# Patient Record
Sex: Female | Born: 1945 | Race: White | Hispanic: No | Marital: Married | Smoking: Never smoker
Health system: Southern US, Community
[De-identification: ages and names within clinical notes are randomized; demographics above are authoritative.]

## PROBLEM LIST (undated history)

## (undated) DIAGNOSIS — Z860101 Personal history of adenomatous and serrated colon polyps: Secondary | ICD-10-CM

## (undated) DIAGNOSIS — IMO0002 Reserved for concepts with insufficient information to code with codable children: Secondary | ICD-10-CM

## (undated) DIAGNOSIS — I700 Atherosclerosis of aorta: Secondary | ICD-10-CM

## (undated) DIAGNOSIS — M7500 Adhesive capsulitis of unspecified shoulder: Secondary | ICD-10-CM

## (undated) DIAGNOSIS — R110 Nausea: Secondary | ICD-10-CM

## (undated) DIAGNOSIS — N809 Endometriosis, unspecified: Secondary | ICD-10-CM

## (undated) DIAGNOSIS — U071 COVID-19: Secondary | ICD-10-CM

## (undated) DIAGNOSIS — G8929 Other chronic pain: Secondary | ICD-10-CM

## (undated) DIAGNOSIS — N979 Female infertility, unspecified: Secondary | ICD-10-CM

## (undated) DIAGNOSIS — M5416 Radiculopathy, lumbar region: Secondary | ICD-10-CM

## (undated) DIAGNOSIS — M8580 Other specified disorders of bone density and structure, unspecified site: Secondary | ICD-10-CM

## (undated) DIAGNOSIS — Z780 Asymptomatic menopausal state: Secondary | ICD-10-CM

## (undated) HISTORY — DX: Radiculopathy, lumbar region: M54.16

## (undated) HISTORY — DX: Other chronic pain: G89.29

## (undated) HISTORY — PX: TONSILLECTOMY AND ADENOIDECTOMY: SHX28

## (undated) HISTORY — DX: Female infertility, unspecified: N97.9

## (undated) HISTORY — PX: FOOT SURGERY: SHX648

## (undated) HISTORY — DX: Personal history of adenomatous and serrated colon polyps: Z86.0101

## (undated) HISTORY — PX: CATARACT EXTRACTION: SUR2

## (undated) HISTORY — DX: COVID-19: U07.1

## (undated) HISTORY — DX: Nausea: R11.0

## (undated) HISTORY — DX: Adhesive capsulitis of unspecified shoulder: M75.00

## (undated) HISTORY — DX: Endometriosis, unspecified: N80.9

## (undated) HISTORY — DX: Other specified disorders of bone density and structure, unspecified site: M85.80

## (undated) HISTORY — DX: Atherosclerosis of aorta: I70.0

## (undated) HISTORY — DX: Asymptomatic menopausal state: Z78.0

## (undated) HISTORY — PX: APPENDECTOMY: SHX54

## (undated) HISTORY — DX: Reserved for concepts with insufficient information to code with codable children: IMO0002

---

## 1973-07-23 HISTORY — PX: AUGMENTATION MAMMAPLASTY: SUR837

## 1991-07-24 HISTORY — PX: DILATION AND CURETTAGE OF UTERUS: SHX78

## 1994-09-21 DIAGNOSIS — N809 Endometriosis, unspecified: Secondary | ICD-10-CM

## 1994-09-21 HISTORY — DX: Endometriosis, unspecified: N80.9

## 1994-09-21 HISTORY — PX: PELVIC LAPAROSCOPY: SHX162

## 1998-04-11 ENCOUNTER — Other Ambulatory Visit: Admission: RE | Admit: 1998-04-11 | Discharge: 1998-04-11 | Payer: Self-pay | Admitting: Obstetrics and Gynecology

## 1999-04-13 ENCOUNTER — Other Ambulatory Visit: Admission: RE | Admit: 1999-04-13 | Discharge: 1999-04-13 | Payer: Self-pay | Admitting: Obstetrics and Gynecology

## 1999-12-08 ENCOUNTER — Encounter: Admission: RE | Admit: 1999-12-08 | Discharge: 1999-12-08 | Payer: Self-pay | Admitting: Obstetrics and Gynecology

## 1999-12-08 ENCOUNTER — Encounter: Payer: Self-pay | Admitting: Obstetrics and Gynecology

## 2000-05-10 ENCOUNTER — Other Ambulatory Visit: Admission: RE | Admit: 2000-05-10 | Discharge: 2000-05-10 | Payer: Self-pay | Admitting: Obstetrics and Gynecology

## 2001-06-03 ENCOUNTER — Other Ambulatory Visit: Admission: RE | Admit: 2001-06-03 | Discharge: 2001-06-03 | Payer: Self-pay | Admitting: Obstetrics and Gynecology

## 2001-06-10 ENCOUNTER — Encounter: Admission: RE | Admit: 2001-06-10 | Discharge: 2001-06-10 | Payer: Self-pay | Admitting: Obstetrics and Gynecology

## 2001-06-10 ENCOUNTER — Encounter: Payer: Self-pay | Admitting: Obstetrics and Gynecology

## 2002-06-04 ENCOUNTER — Other Ambulatory Visit: Admission: RE | Admit: 2002-06-04 | Discharge: 2002-06-04 | Payer: Self-pay | Admitting: Obstetrics and Gynecology

## 2002-09-14 ENCOUNTER — Encounter: Payer: Self-pay | Admitting: Family Medicine

## 2002-09-14 ENCOUNTER — Ambulatory Visit (HOSPITAL_COMMUNITY): Admission: RE | Admit: 2002-09-14 | Discharge: 2002-09-14 | Payer: Self-pay | Admitting: Family Medicine

## 2003-06-07 ENCOUNTER — Other Ambulatory Visit: Admission: RE | Admit: 2003-06-07 | Discharge: 2003-06-07 | Payer: Self-pay | Admitting: Obstetrics and Gynecology

## 2003-10-01 ENCOUNTER — Ambulatory Visit (HOSPITAL_COMMUNITY): Admission: RE | Admit: 2003-10-01 | Discharge: 2003-10-01 | Payer: Self-pay | Admitting: Gastroenterology

## 2003-10-01 ENCOUNTER — Encounter (INDEPENDENT_AMBULATORY_CARE_PROVIDER_SITE_OTHER): Payer: Self-pay | Admitting: *Deleted

## 2004-06-01 ENCOUNTER — Encounter: Admission: RE | Admit: 2004-06-01 | Discharge: 2004-06-01 | Payer: Self-pay | Admitting: Chiropractic Medicine

## 2004-06-08 ENCOUNTER — Other Ambulatory Visit: Admission: RE | Admit: 2004-06-08 | Discharge: 2004-06-08 | Payer: Self-pay | Admitting: *Deleted

## 2004-10-03 ENCOUNTER — Encounter: Admission: RE | Admit: 2004-10-03 | Discharge: 2004-10-03 | Payer: Self-pay | Admitting: Sports Medicine

## 2004-11-27 ENCOUNTER — Encounter: Admission: RE | Admit: 2004-11-27 | Discharge: 2004-11-27 | Payer: Self-pay | Admitting: *Deleted

## 2004-11-29 ENCOUNTER — Encounter: Admission: RE | Admit: 2004-11-29 | Discharge: 2004-11-29 | Payer: Self-pay | Admitting: Gastroenterology

## 2005-04-24 ENCOUNTER — Encounter: Admission: RE | Admit: 2005-04-24 | Discharge: 2005-04-24 | Payer: Self-pay | Admitting: *Deleted

## 2005-08-08 ENCOUNTER — Other Ambulatory Visit: Admission: RE | Admit: 2005-08-08 | Discharge: 2005-08-08 | Payer: Self-pay | Admitting: Obstetrics & Gynecology

## 2005-08-16 ENCOUNTER — Other Ambulatory Visit: Admission: RE | Admit: 2005-08-16 | Discharge: 2005-08-16 | Payer: Self-pay | Admitting: Obstetrics & Gynecology

## 2005-09-20 HISTORY — PX: OTHER SURGICAL HISTORY: SHX169

## 2005-09-24 ENCOUNTER — Encounter: Admission: RE | Admit: 2005-09-24 | Discharge: 2005-09-24 | Payer: Self-pay | Admitting: Obstetrics & Gynecology

## 2005-09-25 ENCOUNTER — Ambulatory Visit (HOSPITAL_BASED_OUTPATIENT_CLINIC_OR_DEPARTMENT_OTHER): Admission: RE | Admit: 2005-09-25 | Discharge: 2005-09-25 | Payer: Self-pay | Admitting: Obstetrics & Gynecology

## 2005-09-25 ENCOUNTER — Encounter (INDEPENDENT_AMBULATORY_CARE_PROVIDER_SITE_OTHER): Payer: Self-pay | Admitting: Specialist

## 2006-04-25 ENCOUNTER — Encounter: Admission: RE | Admit: 2006-04-25 | Discharge: 2006-04-25 | Payer: Self-pay | Admitting: Obstetrics & Gynecology

## 2006-05-03 ENCOUNTER — Encounter: Admission: RE | Admit: 2006-05-03 | Discharge: 2006-05-03 | Payer: Self-pay | Admitting: Obstetrics & Gynecology

## 2006-05-24 ENCOUNTER — Encounter: Admission: RE | Admit: 2006-05-24 | Discharge: 2006-05-24 | Payer: Self-pay | Admitting: Obstetrics & Gynecology

## 2006-08-14 ENCOUNTER — Other Ambulatory Visit: Admission: RE | Admit: 2006-08-14 | Discharge: 2006-08-14 | Payer: Self-pay | Admitting: Obstetrics & Gynecology

## 2006-09-16 ENCOUNTER — Encounter: Admission: RE | Admit: 2006-09-16 | Discharge: 2006-09-16 | Payer: Self-pay | Admitting: Cardiology

## 2006-10-22 ENCOUNTER — Encounter: Admission: RE | Admit: 2006-10-22 | Discharge: 2006-10-22 | Payer: Self-pay | Admitting: Obstetrics & Gynecology

## 2007-04-28 ENCOUNTER — Encounter: Admission: RE | Admit: 2007-04-28 | Discharge: 2007-04-28 | Payer: Self-pay | Admitting: Obstetrics & Gynecology

## 2007-08-21 ENCOUNTER — Other Ambulatory Visit: Admission: RE | Admit: 2007-08-21 | Discharge: 2007-08-21 | Payer: Self-pay | Admitting: Obstetrics & Gynecology

## 2007-11-20 ENCOUNTER — Encounter: Admission: RE | Admit: 2007-11-20 | Discharge: 2007-11-20 | Payer: Self-pay | Admitting: Obstetrics & Gynecology

## 2008-05-28 ENCOUNTER — Encounter: Admission: RE | Admit: 2008-05-28 | Discharge: 2008-05-28 | Payer: Self-pay | Admitting: Obstetrics & Gynecology

## 2008-07-23 DIAGNOSIS — M7500 Adhesive capsulitis of unspecified shoulder: Secondary | ICD-10-CM

## 2008-07-23 HISTORY — DX: Adhesive capsulitis of unspecified shoulder: M75.00

## 2008-08-26 ENCOUNTER — Other Ambulatory Visit: Admission: RE | Admit: 2008-08-26 | Discharge: 2008-08-26 | Payer: Self-pay | Admitting: Obstetrics & Gynecology

## 2008-09-15 ENCOUNTER — Encounter: Admission: RE | Admit: 2008-09-15 | Discharge: 2008-09-15 | Payer: Self-pay | Admitting: Obstetrics & Gynecology

## 2008-09-15 LAB — HM DEXA SCAN

## 2009-06-20 ENCOUNTER — Encounter: Admission: RE | Admit: 2009-06-20 | Discharge: 2009-06-20 | Payer: Self-pay | Admitting: Obstetrics & Gynecology

## 2010-06-21 ENCOUNTER — Encounter: Admission: RE | Admit: 2010-06-21 | Discharge: 2010-06-21 | Payer: Self-pay | Admitting: Obstetrics & Gynecology

## 2010-12-08 NOTE — Op Note (Signed)
NAME:  Nall, Yaritzi A                             ACCOUNT NO.:  [id number]   MEDICAL RECORD NO.:  [id number]                   PATIENT TYPE:  AMB   LOCATION:  ENDO                                 FACILITY:  [facility]   PHYSICIAN:  Anselmo Rod, M.D.               DATE OF BIRTH:  Mar 28, 1946   DATE OF PROCEDURE:  10/01/2003  DATE OF DISCHARGE:  10/01/2003                                 OPERATIVE REPORT   PROCEDURE PERFORMED:  Colonoscopy with cold biopsies x 2.   ENDOSCOPIST:  Anselmo Rod, M.D.   INSTRUMENT USED:  Olympus video colonoscope (adjustable pediatric scope).   INDICATIONS FOR PROCEDURE:  A 65 year old white female, undergoes screening  colonoscopy to rule out colonic polyps, masses, etc.   PREPROCEDURE PREPARATION:  Informed consent was procured from the patient.  The patient was fasted for eight hours prior to the procedure and was  prepped with a bottle of magnesium citrate and a gallon of GoLYTELY the  night prior to the procedure.   PREPROCEDURE PHYSICAL EXAMINATION:  VITAL SIGNS:  The patient with stable  vital signs.  NECK:  Supple.  CHEST:  Clear to auscultation.  CARDIAC:  S1, S2.  Regular.  ABDOMEN:  Soft with normal bowel sounds.   DESCRIPTION OF PROCEDURE:  The patient was placed in left lateral decubitus  position, sedated with 60 mg of Demerol and 6 mg of Versed in slow  incremental doses.  Once the patient was adequately sedated and maintained  on low-flow oxygen and continuous cardiac monitoring, the Olympus video  colonoscope was advanced from the rectum to the cecum.  The appendiceal  orifice and cecal valve were clearly visualized and photographed.  The  terminal ileum appeared normal and without lesions.  A small sessile polyp  was biopsied x 2 from the cecum.  Retroflexion in the rectum revealed small  internal hemorrhoids.  The patient tolerated the procedure well without  immediate complications.   IMPRESSION:  1. Small, nonbleeding internal  hemorrhoids.  2. Small sessile polyp, biopsied from the cecal base.  3. Normal terminal ileum.   RECOMMENDATIONS:  1. Await pathology results.  2. Repeat CRC screening depending on pathology results.  3. Outpatient follow-up within the next 2 weeks, earlier if need be.                                               Anselmo Rod, M.D.    JNM/MEDQ  D:  10/01/2003  T:  10/03/2003  Job:  [id number]   cc:   Laqueta Linden, M.D.  [address], [address]  [place]  [place] [address]  Fax: [phone]

## 2010-12-08 NOTE — Op Note (Signed)
NAME:  Alexandra Cain, Alexandra Cain                   ACCOUNT NO.:  [id number]   MEDICAL RECORD NO.:  [id number]          PATIENT TYPE:  AMB   LOCATION:  [facility]                         FACILITY:  [facility]   PHYSICIAN:  M. Leda Quail, MD  DATE OF BIRTH:  1946-04-20   DATE OF PROCEDURE:  DATE OF DISCHARGE:                                 OPERATIVE REPORT   PREOPERATIVE DIAGNOSIS:  70.  A 65 year old G0 married white female with granulation of the cervix.  2.  Difficulty with tolerating a gynecologic examination.  3.  Endometriosis.   POSTOPERATIVE DIAGNOSIS:  1.  Granulation tissue of the cervix.  2.  Scarring of the cervix and posterior vaginal wall.   PROCEDURE:  Examination under anesthesia, excision of cervical granulation  tissue and cautery of granulation tissue.   ANESTHESIA:  General anesthesia administered with LMA.   ESTIMATED BLOOD LOSS:  Minimal.   FLUIDS:  500 cc of LR.   URINE OUTPUT:  There are 10 cc of clear urine drained with an I&O cath at  the beginning of the procedure.   INDICATIONS:  Ms. Klose is a 65 year old G0 married white female with a  history of difficulty with pelvic exams, as noted with her exam in January,  2007.  She appeared to have some granulation tissue on the cervix, which  bled readily on Pap smear.  Due to the amount of blood that was on her Pap  smear, her Pap was obscured by blood for evaluation.  The patient was  brought back to the office and given benzodiazepine for relaxation.  Although I could visualize the cervix better, it was still a very difficult  exam and there appeared to be some granulation tissue at the vaginal cuff,  which is slightly unusual. as she has never had any procedures done to her  vagina or cervix.  The granulation tissue was biopsied, and the biopsy did  come back showing granulation tissue.  She did have some difficulty with  that exam, but it was better; however, the entire granulation tissue was not  excised, and the  decision was made to proceed with this in the OR for better  relaxation and better visualization and complete excision of this tissue.   PROCEDURE:  The patient was taken to the operating room.  General anesthesia  was administered by anesthesia staff as an LMA.  She was placed in the  dorsal lithotomy position with her legs in Nickerson stirrups.  Her perineum,  thighs, and vagina were prepped and draped in a normal sterile fashion.  A  red rubber Foley catheter was used to drain the bladder of all urine.  A  bivalve speculum was placed in the vagina.  The cervix was visualized.  The  posterior aspect of the cervix did have some scarring to the vaginal cuff.  This was freed with some sharp dissection so the cervix could be completely  visualized.  The granulation tissue was visualized on the cervix.  This was  grasped with long pickups, and the granulation tissue was freed from the  cervix without  difficulty, basically by just pinching this with the long  forceps.  There was some bleeding noted at the areas where the granulation  tissue was removed.  This was cauterized using raw cautery.  The cervix was  completely smooth.  Monsel solution was placed on the cervix.  A bimanual  exam was performed, and the uterus was mobile and small.  The ovaries were  nonpalpable.  Rectovaginal exam was performed at this point, and the  posterior aspect of the uterus was smooth as well.  No nodularity was noted.  There were no rectal masses noted.  At this point, the procedure ended.  All  instruments were removed from the vagina.  The patient tolerated the  procedure well.  She was placed back in the supine position and awakened  from anesthesia without difficulty.  Sponge, lap, and instrument counts were  correct x2.  There were no needles used for the procedure.      Lum Keas, MD  Electronically Signed     MSM/MEDQ  D:  09/25/2005  T:  09/25/2005  Job:  [id number]

## 2011-05-29 ENCOUNTER — Other Ambulatory Visit: Payer: Self-pay | Admitting: Gastroenterology

## 2011-06-01 ENCOUNTER — Ambulatory Visit
Admission: RE | Admit: 2011-06-01 | Discharge: 2011-06-01 | Disposition: A | Discharge status: Home / Self Care | Payer: Medicare Other | Source: Ambulatory Visit | Attending: Gastroenterology | Admitting: Gastroenterology

## 2011-06-01 MED ORDER — IOHEXOL 300 MG/ML  SOLN
100.0000 mL | Freq: Once | INTRAMUSCULAR | Status: AC | PRN
  Administered 2011-06-01: 100 mL via INTRAVENOUS

## 2011-07-13 ENCOUNTER — Other Ambulatory Visit: Payer: Self-pay | Admitting: Obstetrics & Gynecology

## 2011-07-13 DIAGNOSIS — Z1231 Encounter for screening mammogram for malignant neoplasm of breast: Secondary | ICD-10-CM

## 2011-08-02 ENCOUNTER — Ambulatory Visit
Admission: RE | Admit: 2011-08-02 | Discharge: 2011-08-02 | Disposition: A | Discharge status: Home / Self Care | Payer: Medicare Other | Source: Ambulatory Visit | Attending: Obstetrics & Gynecology | Admitting: Obstetrics & Gynecology

## 2011-08-02 DIAGNOSIS — Z1231 Encounter for screening mammogram for malignant neoplasm of breast: Secondary | ICD-10-CM

## 2011-10-24 DIAGNOSIS — M9981 Other biomechanical lesions of cervical region: Secondary | ICD-10-CM | POA: Diagnosis not present

## 2011-10-24 DIAGNOSIS — M5030 Other cervical disc degeneration, unspecified cervical region: Secondary | ICD-10-CM | POA: Diagnosis not present

## 2011-10-25 DIAGNOSIS — M9981 Other biomechanical lesions of cervical region: Secondary | ICD-10-CM | POA: Diagnosis not present

## 2011-10-25 DIAGNOSIS — M5030 Other cervical disc degeneration, unspecified cervical region: Secondary | ICD-10-CM | POA: Diagnosis not present

## 2011-10-29 DIAGNOSIS — M5030 Other cervical disc degeneration, unspecified cervical region: Secondary | ICD-10-CM | POA: Diagnosis not present

## 2011-10-29 DIAGNOSIS — M9981 Other biomechanical lesions of cervical region: Secondary | ICD-10-CM | POA: Diagnosis not present

## 2011-10-30 DIAGNOSIS — H6120 Impacted cerumen, unspecified ear: Secondary | ICD-10-CM | POA: Diagnosis not present

## 2011-10-31 DIAGNOSIS — M5030 Other cervical disc degeneration, unspecified cervical region: Secondary | ICD-10-CM | POA: Diagnosis not present

## 2011-10-31 DIAGNOSIS — M9981 Other biomechanical lesions of cervical region: Secondary | ICD-10-CM | POA: Diagnosis not present

## 2011-11-02 DIAGNOSIS — M5030 Other cervical disc degeneration, unspecified cervical region: Secondary | ICD-10-CM | POA: Diagnosis not present

## 2011-11-02 DIAGNOSIS — M9981 Other biomechanical lesions of cervical region: Secondary | ICD-10-CM | POA: Diagnosis not present

## 2011-11-05 DIAGNOSIS — M9981 Other biomechanical lesions of cervical region: Secondary | ICD-10-CM | POA: Diagnosis not present

## 2011-11-05 DIAGNOSIS — M5030 Other cervical disc degeneration, unspecified cervical region: Secondary | ICD-10-CM | POA: Diagnosis not present

## 2011-11-08 DIAGNOSIS — Z01419 Encounter for gynecological examination (general) (routine) without abnormal findings: Secondary | ICD-10-CM | POA: Diagnosis not present

## 2011-11-08 DIAGNOSIS — Z124 Encounter for screening for malignant neoplasm of cervix: Secondary | ICD-10-CM | POA: Diagnosis not present

## 2011-11-08 DIAGNOSIS — Z0000 Encounter for general adult medical examination without abnormal findings: Secondary | ICD-10-CM | POA: Diagnosis not present

## 2011-11-20 DIAGNOSIS — E782 Mixed hyperlipidemia: Secondary | ICD-10-CM | POA: Diagnosis not present

## 2011-11-22 ENCOUNTER — Ambulatory Visit
Admission: RE | Admit: 2011-11-22 | Discharge: 2011-11-22 | Disposition: A | Discharge status: Home / Self Care | Payer: Medicare Other | Source: Ambulatory Visit | Attending: Internal Medicine | Admitting: Internal Medicine

## 2011-11-22 ENCOUNTER — Other Ambulatory Visit: Payer: Self-pay | Admitting: Internal Medicine

## 2011-11-22 DIAGNOSIS — R51 Headache: Secondary | ICD-10-CM

## 2011-11-22 DIAGNOSIS — G319 Degenerative disease of nervous system, unspecified: Secondary | ICD-10-CM | POA: Diagnosis not present

## 2011-12-06 DIAGNOSIS — R51 Headache: Secondary | ICD-10-CM | POA: Diagnosis not present

## 2012-02-12 DIAGNOSIS — L821 Other seborrheic keratosis: Secondary | ICD-10-CM | POA: Diagnosis not present

## 2012-02-12 DIAGNOSIS — D239 Other benign neoplasm of skin, unspecified: Secondary | ICD-10-CM | POA: Diagnosis not present

## 2012-03-18 DIAGNOSIS — R1013 Epigastric pain: Secondary | ICD-10-CM | POA: Diagnosis not present

## 2012-03-18 DIAGNOSIS — H00019 Hordeolum externum unspecified eye, unspecified eyelid: Secondary | ICD-10-CM | POA: Diagnosis not present

## 2012-03-25 DIAGNOSIS — H00019 Hordeolum externum unspecified eye, unspecified eyelid: Secondary | ICD-10-CM | POA: Diagnosis not present

## 2012-03-25 DIAGNOSIS — H43819 Vitreous degeneration, unspecified eye: Secondary | ICD-10-CM | POA: Diagnosis not present

## 2012-03-25 DIAGNOSIS — H52209 Unspecified astigmatism, unspecified eye: Secondary | ICD-10-CM | POA: Diagnosis not present

## 2012-03-25 DIAGNOSIS — H2510 Age-related nuclear cataract, unspecified eye: Secondary | ICD-10-CM | POA: Diagnosis not present

## 2012-05-07 DIAGNOSIS — Z23 Encounter for immunization: Secondary | ICD-10-CM | POA: Diagnosis not present

## 2012-06-02 DIAGNOSIS — H6120 Impacted cerumen, unspecified ear: Secondary | ICD-10-CM | POA: Diagnosis not present

## 2012-06-17 DIAGNOSIS — M79609 Pain in unspecified limb: Secondary | ICD-10-CM | POA: Diagnosis not present

## 2012-11-10 ENCOUNTER — Telehealth: Payer: Self-pay | Admitting: Obstetrics and Gynecology

## 2012-11-10 ENCOUNTER — Other Ambulatory Visit: Payer: Self-pay | Admitting: Obstetrics & Gynecology

## 2012-11-10 ENCOUNTER — Ambulatory Visit: Payer: Self-pay | Admitting: Obstetrics and Gynecology

## 2012-11-10 NOTE — Telephone Encounter (Signed)
Pt cancel appointment due to Medicare amount $136.43. Pt will have her annual done at her primary physician.

## 2012-11-10 NOTE — Telephone Encounter (Signed)
Pt is wanting a sample of Combipack .05/.25. [place] Si box. She does not want to refill since she will be seeing Dr. Edward Jolly for the first time on 11/13/12. She just needs a sample to hold her over until then. She was supposed to put on a new patch yesterday but does not have any. She was trying to hold out until she was seen.

## 2012-11-11 NOTE — Telephone Encounter (Signed)
Request for Combi-patch patient has appt on 11/13/12 with BS

## 2012-11-11 NOTE — Telephone Encounter (Signed)
Patient given one box of Combipack till see Dr. Edward Jolly on Thursday.

## 2012-11-12 ENCOUNTER — Encounter: Payer: Self-pay | Admitting: Obstetrics and Gynecology

## 2012-11-13 ENCOUNTER — Ambulatory Visit (INDEPENDENT_AMBULATORY_CARE_PROVIDER_SITE_OTHER): Payer: Medicare Other | Admitting: Obstetrics and Gynecology

## 2012-11-13 ENCOUNTER — Encounter: Payer: Self-pay | Admitting: Obstetrics and Gynecology

## 2012-11-13 VITALS — BP 120/70 | Ht 62.75 in | Wt 119.0 lb

## 2012-11-13 DIAGNOSIS — N6320 Unspecified lump in the left breast, unspecified quadrant: Secondary | ICD-10-CM

## 2012-11-13 DIAGNOSIS — Z01419 Encounter for gynecological examination (general) (routine) without abnormal findings: Secondary | ICD-10-CM

## 2012-11-13 DIAGNOSIS — N9089 Other specified noninflammatory disorders of vulva and perineum: Secondary | ICD-10-CM | POA: Diagnosis not present

## 2012-11-13 DIAGNOSIS — N951 Menopausal and female climacteric states: Secondary | ICD-10-CM | POA: Insufficient documentation

## 2012-11-13 DIAGNOSIS — N63 Unspecified lump in unspecified breast: Secondary | ICD-10-CM

## 2012-11-13 DIAGNOSIS — Z0000 Encounter for general adult medical examination without abnormal findings: Secondary | ICD-10-CM

## 2012-11-13 DIAGNOSIS — N952 Postmenopausal atrophic vaginitis: Secondary | ICD-10-CM | POA: Diagnosis not present

## 2012-11-13 LAB — POCT URINALYSIS DIPSTICK
Glucose, UA: NEGATIVE
Urobilinogen, UA: NEGATIVE

## 2012-11-13 NOTE — Patient Instructions (Addendum)
EXERCISE AND DIET:  We recommended that you start or continue a regular exercise program for good health. Regular exercise means any activity that makes your heart beat faster and makes you sweat.  We recommend exercising at least 30 minutes per day at least 3 days a week, preferably 4 or 5.  We also recommend a diet low in fat and sugar.  Inactivity, poor dietary choices and obesity can cause diabetes, heart attack, stroke, and kidney damage, among others.    ALCOHOL AND SMOKING:  Women should limit their alcohol intake to no more than 7 drinks/beers/glasses of wine (combined, not each!) per week. Moderation of alcohol intake to this level decreases your risk of breast cancer and liver damage. And of course, no recreational drugs are part of a healthy lifestyle.  And absolutely no smoking or even second hand smoke. Most people know smoking can cause heart and lung diseases, but did you know it also contributes to weakening of your bones? Aging of your skin?  Yellowing of your teeth and nails?  CALCIUM AND VITAMIN D:  Adequate intake of calcium and Vitamin D are recommended.  The recommendations for exact amounts of these supplements seem to change often, but generally speaking 600 mg of calcium (either carbonate or citrate) and 800 units of Vitamin D per day seems prudent. Certain women may benefit from higher intake of Vitamin D.  If you are among these women, your doctor will have told you during your visit.    PAP SMEARS:  Pap smears, to check for cervical cancer or precancers,  have traditionally been done yearly, although recent scientific advances have shown that most women can have pap smears less often.  However, every woman still should have a physical exam from her gynecologist every year. It will include a breast check, inspection of the vulva and vagina to check for abnormal growths or skin changes, a visual exam of the cervix, and then an exam to evaluate the size and shape of the uterus and  ovaries.  And after 67 years of age, a rectal exam is indicated to check for rectal cancers. We will also provide age appropriate advice regarding health maintenance, like when you should have certain vaccines, screening for sexually transmitted diseases, bone density testing, colonoscopy, mammograms, etc.   MAMMOGRAMS:  All women over 40 years old should have a yearly mammogram. Many facilities now offer a "3D" mammogram, which may cost around $50 extra out of pocket. If possible,  we recommend you accept the option to have the 3D mammogram performed.  It both reduces the number of women who will be called back for extra views which then turn out to be normal, and it is better than the routine mammogram at detecting truly abnormal areas.    COLONOSCOPY:  Colonoscopy to screen for colon cancer is recommended for all women at age 50.  We know, you hate the idea of the prep.  We agree, BUT, having colon cancer and not knowing it is worse!!  Colon cancer so often starts as a polyp that can be seen and removed at colonscopy, which can quite literally save your life!  And if your first colonoscopy is normal and you have no family history of colon cancer, most women don't have to have it again for 10 years.  Once every ten years, you can do something that may end up saving your life, right?  We will be happy to help you get it scheduled when you are ready.    Be sure to check your insurance coverage so you understand how much it will cost.  It may be covered as a preventative service at no cost, but you should check your particular policy.    Atrophic Vaginitis Atrophic vaginitis is a problem of low levels of estrogen in women. This problem can happen at any age. It is most common in women who have gone through menopause ("the change").  HOW WILL I KNOW IF I HAVE THIS PROBLEM? You may have:  Trouble with peeing (urinating), such as:  Going to the bathroom often.  A hard time holding your pee until you reach a  bathroom.  Leaking pee.  Having pain when you pee.  Itching or a burning feeling.  Vaginal bleeding and spotting.  Pain during sex.  Dryness of the vagina.  A yellow, bad-smelling fluid (discharge) coming from the vagina. HOW WILL MY DOCTOR CHECK FOR THIS PROBLEM?  During your exam, your doctor will likely find the problem.  If there is a vaginal fluid, it may be checked for infection. HOW WILL THIS PROBLEM BE TREATED? Keep the vulvar skin as clean as possible. Moisturizers and lubricants can help with some of the symptoms. Estrogen replacement can help. There are 2 ways to take estrogen:  Systemic estrogen gets estrogen to your whole body. It takes many weeks or months before the symptoms get better.  You take an estrogen pill.  You use a skin patch. This is a patch that you put on your skin.  If you still have your uterus, your doctor may ask you to take a hormone. Talk to your doctor about the right medicine for you.  Estrogen cream.  This puts estrogen only at the part of your body where you apply it. The cream is put into the vagina or put on the vulvar skin. For some women, estrogen cream works faster than pills or the patch. CAN ALL WOMEN WITH THIS PROBLEM USE ESTROGEN? No. Women with certain types of cancer, liver problems, or problems with blood clots should not take estrogen. Your doctor can help you decide the best treatment for your symptoms. Document Released: 12/26/2007 Document Revised: 10/01/2011 Document Reviewed: 12/26/2007 [employer] Patient Information 2013 [employer].   

## 2012-11-13 NOTE — Progress Notes (Signed)
Patient ID: Alexandra Cain, female   DOB: 09-Apr-1946, 67 y.o.   MRN: [id number] 67 y.o.  Married  Caucasian female   G0P0 here for annual exam.   Patient asking about various issues.  Asking about anitperspirants.  Asking about baby aspirin daily.  T cholesterol is about 220 with elevated LDL cholesterol Patient on HRT for 20 years.  Tried to stop twice and had significant hot flashes.  Asking about whether or not she should continue. Patient having discharge vaginally.  No itching, burning, or odor.  No over the counter treatments.   Notes a wart on her bottom.   No LMP recorded. Patient is postmenopausal.          Sexually active: yes  The current method of family planning is post menopausal status.    Exercising: biking, walking Last mammogram:  07/2011 wnl: [facility] Last pap smear:11-08-11 wnl:no HPV done History of abnormal pap: no Smoking: no Alcohol: no Last colonoscopy: 2011 revealed polyps: Dr. Loreta Ave. Next colon due 2016 Last Bone Density:  09/2008 [facility] Osteopenia, T score spine - 1.1, hip -0.7.  Did not tolerate Fosamax due to stomach issues.   Last tetanus shot: unsure Last cholesterol check: 2013  Hgb: 14.3               Urine: neg    Health Maintenance  Topic Date Due  . Pap Smear  11/07/2012  . Influenza Vaccine  03/23/2013  . Mammogram  08/01/2013  . Colonoscopy  09/01/2013  . Tetanus/tdap  10/21/2013  . Pneumococcal Polysaccharide Vaccine Age 66 And Over  Completed  . Zostavax  Completed    Family History  Problem Relation Age of Onset  . Dementia Mother   . Breast cancer Paternal Grandmother     There is no problem list on file for this patient.   Past Medical History  Diagnosis Date  . Frozen shoulder 2010  . Menopause   . Endometriosis 3/96    bx neg, suspicious for endo  . Infertility, female   . Ulcer   . Diabetes mellitus without complication     Past Surgical History  Procedure Laterality Date  . Dilation and curettage of  uterus  1993  . Appendectomy    . Other surgical history  3/07    excision fo cx granuloma tissue under anesthesia  . Tonsillectomy and adenoidectomy    . Augmentation mammaplasty  1975  . Pelvic laparoscopy  3/96    due to pelvic pain    Allergies: Review of patient's allergies indicates no known allergies.  Current Outpatient Prescriptions  Medication Sig Dispense Refill  . aspirin EC 81 MG tablet Take 81 mg by mouth daily.      . Calcium 1500 MG tablet Take 1,500 mg by mouth daily.      . cholecalciferol (VITAMIN D) 1000 UNITS tablet Take 1,000 Units by mouth daily.      . COMBIPATCH 0.05-0.25 MG/DAY APPLY ONE PATCH TWICE A WEEK  8 patch  0  . Multiple Vitamin (MULTIVITAMIN) tablet Take 1 tablet by mouth daily.      . Omega-3 Fatty Acids (FISH OIL PO) Take by mouth daily.       No current facility-administered medications for this visit.    ROS: Pertinent items are noted in HPI.  Social Hx:    Exam:    BP 120/70  Ht 5' 2.75" (1.594 m)  Wt 119 lb (53.978 kg)  BMI 21.24 kg/m2  Wt Readings from Last 3 Encounters:  11/13/12 119 lb (53.978 kg)     Ht Readings from Last 3 Encounters:  11/13/12 5' 2.75" (1.594 m)    General appearance: alert, cooperative and appears stated age Head: Normocephalic, without obvious abnormality, atraumatic Neck: no adenopathy, supple, symmetrical, trachea midline and thyroid not enlarged, symmetric, no tenderness/mass/nodules Lungs: clear to auscultation bilaterally Breasts: Bilateral implants.  Right breast - Inspection negative, No nipple retraction or dimpling, No nipple discharge or bleeding, No axillary or supraclavicular adenopathy, Normal to palpation without dominant masses.  Scarring palpable along the areolar incision of the right breast.  Left breast - inspection negative, no nipple retraction or dimpling, no nipple discharge or bleeding.  No axillary or supraclavicular adenopathy.  Small 3 - 4 mm nodule palpable at the areolar  margin at 12 o'clock, nontender. Heart: regular rate and rhythm Abdomen: soft, non-tender; bowel sounds normal; no masses,  no organomegaly Extremities: extremities normal, atraumatic, no cyanosis or edema Skin: Skin color, texture, turgor normal. No rashes or lesions Lymph nodes: Cervical, supraclavicular, and axillary nodes normal. No abnormal inguinal nodes palpated Neurologic: Grossly normal   Pelvic: External genitalia:  Right perineal body with thickening and verugated area.              Urethra:  normal appearing urethra with no masses, tenderness or lesions              Bartholins and Skenes: normal                 Vagina: Erythematous appearing vaginal mucosa with increased yellow discharge.              Cervix: normal appears flush with the vaginal apex.  Inflammation noted.  Bleeds easily with contact with the speculum.              Pap taken: yes and HR HPV requested.        Bimanual Exam:  Uterus:  uterus is normal size, shape, consistency and nontender                                      Adnexa: normal adnexa in size, nontender and no masses                                      Rectovaginal: Confirms                                      Anus:  normal sphincter tone, no lesions  Wet prep - No clue cells.  No hyphae.  PH 5.5  A: Left breast mass. HRT patient. Overdue for mammogram Bilateral breast implants. 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     P: Bilateral diagnostic mammogram and left breast ultrasound. pap smear and HR HPV Patient will return for re-evaluation for HRT and discussion of possible vaginal estrogen therapy after mammogram is completed and normal. Will also proceed with vulvar biopsy. return annually or prn     An After Visit Summary was printed and given to the patient.

## 2012-11-14 ENCOUNTER — Ambulatory Visit
Admission: RE | Admit: 2012-11-14 | Discharge: 2012-11-14 | Disposition: A | Discharge status: Home / Self Care | Payer: Medicare Other | Source: Ambulatory Visit | Attending: Obstetrics and Gynecology | Admitting: Obstetrics and Gynecology

## 2012-11-14 DIAGNOSIS — N6320 Unspecified lump in the left breast, unspecified quadrant: Secondary | ICD-10-CM

## 2012-11-14 DIAGNOSIS — N63 Unspecified lump in unspecified breast: Secondary | ICD-10-CM | POA: Diagnosis not present

## 2012-11-14 DIAGNOSIS — Z1151 Encounter for screening for human papillomavirus (HPV): Secondary | ICD-10-CM | POA: Diagnosis not present

## 2012-11-14 DIAGNOSIS — Z01419 Encounter for gynecological examination (general) (routine) without abnormal findings: Secondary | ICD-10-CM | POA: Diagnosis not present

## 2012-11-17 LAB — HEMOGLOBIN, FINGERSTICK: Hemoglobin, fingerstick: 14.3 g/dL (ref 12.0–16.0)

## 2012-11-19 ENCOUNTER — Telehealth: Payer: Self-pay | Admitting: Obstetrics and Gynecology

## 2012-11-19 NOTE — Telephone Encounter (Signed)
Called pt about scheduling her vulvar biopsy. Pt said she will call back when ready to schedule due to being out of town for a few weeks. She also wanted to let you know that she had her MMG completed and they told her she looked completely healthy. She would like to continue taking estrogen. She wanted to know if she could have more samples or if she will need to refill her prescription. Please advise.

## 2012-11-20 ENCOUNTER — Other Ambulatory Visit: Payer: Self-pay | Admitting: Obstetrics and Gynecology

## 2012-11-20 MED ORDER — ESTRADIOL-NORETHINDRONE ACET 0.05-0.25 MG/DAY TD PTTW: 1.0000 | MEDICATED_PATCH | TRANSDERMAL | Status: DC

## 2012-11-20 NOTE — Telephone Encounter (Signed)
Alexandra Cain,  Please let the patient know that I have refilled her Combipatch prescription for one year.  [employer]

## 2012-11-21 ENCOUNTER — Telehealth: Payer: Self-pay | Admitting: *Deleted

## 2012-11-21 NOTE — Telephone Encounter (Signed)
Left message on CB# of need to return call to our office. 

## 2012-11-21 NOTE — Telephone Encounter (Signed)
Message copied by Elnora Morrison on Fri Nov 21, 2012  4:58 PM ------      Message from: Conley Simmonds      Created: Fri Nov 21, 2012  7:26 AM      Regarding: please schedule a breast recheck appointment       Hi Alexandra Cain,            Will you contact the patient to schedule a breast recheck appointment.  I did receive her final mammogram and ultrasound report, but she needs a recheck with me in 4 - 6 weeks.  Please continue to keep in mammogram hold until this is completed.            IF she wants to proceed with the vulvar biopsy at the same time, this will need to have a precert.  Diagnosis is vulvar lesion.            Thank you!            Conley Simmonds ------

## 2012-11-21 NOTE — Telephone Encounter (Signed)
Left message onf CB# home and cell of need for patient to call to follow  Up with Dr. Edward Jolly of mammogram and u/s reports. Appointment needed with Dr. Ander Purpura

## 2012-11-24 ENCOUNTER — Telehealth: Payer: Self-pay | Admitting: *Deleted

## 2012-11-24 NOTE — Telephone Encounter (Signed)
Alexandra Cain,  I recall that the patient may be currently traveling.  [employer]

## 2012-11-24 NOTE — Telephone Encounter (Signed)
Left message on all CB# s of need to call our office to follow up with Dr. Edward Jolly for [facility] findings.

## 2012-11-24 NOTE — Telephone Encounter (Signed)
Message copied by Elnora Morrison on Mon Nov 24, 2012  4:37 PM ------      Message from: Conley Simmonds      Created: Fri Nov 21, 2012  7:26 AM      Regarding: please schedule a breast recheck appointment       Hi Fannie Knee,            Will you contact the patient to schedule a breast recheck appointment.  I did receive her final mammogram and ultrasound report, but she needs a recheck with me in 4 - 6 weeks.  Please continue to keep in mammogram hold until this is completed.            IF she wants to proceed with the vulvar biopsy at the same time, this will need to have a precert.  Diagnosis is vulvar lesion.            Thank you!            Conley Simmonds ------

## 2012-11-24 NOTE — Telephone Encounter (Signed)
Have called several times on Friday and today , 11/24/2012, . Left message at home and cell # of need to return call to schedule appt. Will try tomorrow also. sue

## 2012-11-24 NOTE — Telephone Encounter (Signed)
Left message on cell and home # of need to call our office and schedule appt. Follow up with Dr. Edward Jolly for mammogram findings.

## 2012-11-25 ENCOUNTER — Telehealth: Payer: Self-pay | Admitting: *Deleted

## 2012-11-25 NOTE — Telephone Encounter (Signed)
Message copied by Elnora Morrison on Tue Nov 25, 2012  9:31 AM ------      Message from: Conley Simmonds      Created: Fri Nov 21, 2012  7:26 AM      Regarding: please schedule a breast recheck appointment       Hi Fannie Knee,            Will you contact the patient to schedule a breast recheck appointment.  I did receive her final mammogram and ultrasound report, but she needs a recheck with me in 4 - 6 weeks.  Please continue to keep in mammogram hold until this is completed.            IF she wants to proceed with the vulvar biopsy at the same time, this will need to have a precert.  Diagnosis is vulvar lesion.            Thank you!            Conley Simmonds ------

## 2012-11-25 NOTE — Telephone Encounter (Signed)
Patient notified of Dr. Edward Jolly response . Patient states will call back and schedule for this. States will be traveling and will have to check her schedule. Will keep report in mammogram hold. sue

## 2012-11-26 NOTE — Telephone Encounter (Signed)
Patient placed in recall list 04 for recheck with Dr. Edward Jolly.

## 2012-11-26 NOTE — Telephone Encounter (Signed)
Thank for for contacting patient and keeping in mammogram hold.  [employer]

## 2012-12-01 NOTE — Telephone Encounter (Signed)
Left detailed message that ok to do biopsy the same day as her breast recheck//kn

## 2012-12-29 DIAGNOSIS — M9981 Other biomechanical lesions of cervical region: Secondary | ICD-10-CM | POA: Diagnosis not present

## 2012-12-29 DIAGNOSIS — M5030 Other cervical disc degeneration, unspecified cervical region: Secondary | ICD-10-CM | POA: Diagnosis not present

## 2012-12-30 ENCOUNTER — Ambulatory Visit (INDEPENDENT_AMBULATORY_CARE_PROVIDER_SITE_OTHER): Payer: Medicare Other | Admitting: Obstetrics & Gynecology

## 2012-12-30 VITALS — BP 104/62 | HR 60 | Resp 16 | Ht 62.5 in | Wt 119.8 lb

## 2012-12-30 DIAGNOSIS — F411 Generalized anxiety disorder: Secondary | ICD-10-CM | POA: Diagnosis not present

## 2012-12-30 DIAGNOSIS — R109 Unspecified abdominal pain: Secondary | ICD-10-CM | POA: Diagnosis not present

## 2012-12-30 DIAGNOSIS — Z0000 Encounter for general adult medical examination without abnormal findings: Secondary | ICD-10-CM | POA: Diagnosis not present

## 2012-12-30 DIAGNOSIS — N907 Vulvar cyst: Secondary | ICD-10-CM

## 2012-12-30 DIAGNOSIS — N9489 Other specified conditions associated with female genital organs and menstrual cycle: Secondary | ICD-10-CM | POA: Diagnosis not present

## 2012-12-30 DIAGNOSIS — E756 Lipid storage disorder, unspecified: Secondary | ICD-10-CM

## 2012-12-30 DIAGNOSIS — R141 Gas pain: Secondary | ICD-10-CM

## 2012-12-30 DIAGNOSIS — R1909 Other intra-abdominal and pelvic swelling, mass and lump: Secondary | ICD-10-CM

## 2012-12-30 DIAGNOSIS — M7090 Unspecified soft tissue disorder related to use, overuse and pressure of unspecified site: Secondary | ICD-10-CM

## 2012-12-30 MED ORDER — SERTRALINE HCL 50 MG PO TABS: 50.0000 mg | ORAL_TABLET | Freq: Every day | ORAL | Status: DC

## 2012-12-30 NOTE — Progress Notes (Signed)
67 y.o. Married Caucasian female here for possible vulvar biopsy.  Patient well known to me.  She has several questions today as well as her concern over having the biopsy.  The areas Dr. Edward Jolly was concerned about the patient feels have been present for a long time.  She also feels I have discussed these with her in the past.  She denies vaginal bleeding or pelvic pain.  She reports not being very happy with her last visit.  Here for my opinion.  She continues to have abdominal pain that has been fully evaluated.  She is experiencing a lot of stress over the care of her mother.  Her mother is in an assisted living facility but has Alzheimer's and just doesn't remember anything.  Pt went on trip a few weeks ago to [place] where they rested and biked.  Pain was absent for entire trip.  Patient feels like pain restarted as soon as she got of the airplane.  She felt the stress return at the exact same time. She now realizes how important stress management is going to be for her.    She would like to have a Ca-125 check if possible and needs her lipids checked as well.    Exam:   BP 104/62  Pulse 60  Resp 16  Ht 5' 2.5" (1.588 m)  Wt 119 lb 12.8 oz (54.341 kg)  BMI 21.55 kg/m2  LMP 07/24/1991 General appearance: alert and cooperative Lymph:  Inguinal adenopathy: neg  External genitalia:  atrophic appearance and two small sebaceous cysts present on right labia.  No other findings noted.  Patient and I looked together with mirror and agree these are the areas of concern Pap taken: no  I do not feel these vulvar lesions need to be biopsied.  Assessment:  Sebaceous cysts of vulva            IBS             Abdominal pain from stressors  Plan:  Zoloft 50mg  to pharmacy.  Recheck 1 month. Ca 125 and FLP  ~15 minutes spent with patient >50% of time was in face to face discussion of above.

## 2012-12-30 NOTE — Patient Instructions (Addendum)
We will call you with your lab results 

## 2012-12-31 ENCOUNTER — Encounter: Payer: Self-pay | Admitting: Obstetrics and Gynecology

## 2013-01-01 ENCOUNTER — Encounter: Payer: Self-pay | Admitting: Obstetrics & Gynecology

## 2013-01-01 DIAGNOSIS — M5030 Other cervical disc degeneration, unspecified cervical region: Secondary | ICD-10-CM | POA: Diagnosis not present

## 2013-01-01 DIAGNOSIS — M9981 Other biomechanical lesions of cervical region: Secondary | ICD-10-CM | POA: Diagnosis not present

## 2013-01-02 ENCOUNTER — Telehealth: Payer: Self-pay | Admitting: Obstetrics & Gynecology

## 2013-01-02 NOTE — Telephone Encounter (Signed)
Called to notify asDr Hyacinth Meeker instructed.  Patient reports medication is working and she wants to continue.  She is noticing improvement already.  She thought Dr Hyacinth Meeker instructed her to call if this "hot" feeling occurred so she was wanting to be sure it was ok to continue.  Verified with Dr Hyacinth Meeker that this ok and she may continue.  Call prn.

## 2013-01-02 NOTE — Telephone Encounter (Signed)
Will be about two full weeks before she will be able to say it is not working.  Please stay on it.  I really think it will help if she will give it a little time.

## 2013-01-02 NOTE — Telephone Encounter (Signed)
Patient was Rx zoloft 50 mg on Tuesday for stress. She has taken 3 pills and is now experiencing some discomfort. She is burning up and sweating very bad.

## 2013-01-02 NOTE — Telephone Encounter (Signed)
See note below from patient. Called patient states is a hot feeling not like a hot flash, just hot enough to have to fan herself. Asked if this is short term? Medication does seen to be helping with stress only though she has taken 3 pills. States if this not harmful to her she would like to continue medication. Please advise. sue

## 2013-01-05 DIAGNOSIS — E756 Lipid storage disorder, unspecified: Secondary | ICD-10-CM | POA: Diagnosis not present

## 2013-01-05 DIAGNOSIS — R109 Unspecified abdominal pain: Secondary | ICD-10-CM | POA: Diagnosis not present

## 2013-01-05 DIAGNOSIS — Z0000 Encounter for general adult medical examination without abnormal findings: Secondary | ICD-10-CM | POA: Diagnosis not present

## 2013-01-05 DIAGNOSIS — R1909 Other intra-abdominal and pelvic swelling, mass and lump: Secondary | ICD-10-CM | POA: Diagnosis not present

## 2013-01-05 LAB — LIPID PANEL
HDL: 61 mg/dL (ref >39)
LDL Cholesterol: 165 mg/dL — ABNORMAL HIGH (ref 0–99)
Triglycerides: 55 mg/dL (ref <150)
VLDL: 11 mg/dL (ref 0–40)

## 2013-01-07 DIAGNOSIS — M9981 Other biomechanical lesions of cervical region: Secondary | ICD-10-CM | POA: Diagnosis not present

## 2013-01-07 DIAGNOSIS — M5030 Other cervical disc degeneration, unspecified cervical region: Secondary | ICD-10-CM | POA: Diagnosis not present

## 2013-01-08 ENCOUNTER — Telehealth: Payer: Self-pay

## 2013-01-08 NOTE — Telephone Encounter (Signed)
6/19 lmtcb//kn 

## 2013-01-12 DIAGNOSIS — M5030 Other cervical disc degeneration, unspecified cervical region: Secondary | ICD-10-CM | POA: Diagnosis not present

## 2013-01-12 DIAGNOSIS — M9981 Other biomechanical lesions of cervical region: Secondary | ICD-10-CM | POA: Diagnosis not present

## 2013-01-15 NOTE — Telephone Encounter (Signed)
6/25 lmtcb//kn

## 2013-01-16 ENCOUNTER — Telehealth: Payer: Self-pay

## 2013-01-16 NOTE — Telephone Encounter (Signed)
Spoke with patient re: lab work, will fax copy to Dr Kirby Funk at [facility]. She will call their office to see if she needs appointment. Did schedule 6 month FLP with [place] if he does not need to see her. Also, started the Zoloft and feels this is helping, but having itching all over body, mostly arms. She does not have any hives or changes to the skin just the itching. No trouble breathing. Please advise.

## 2013-01-16 NOTE — Telephone Encounter (Signed)
FYI: appointment next 01/21/13 tues with pcp Dr. Valentina Lucks @ [facility], no need to call patient.

## 2013-01-20 DIAGNOSIS — E785 Hyperlipidemia, unspecified: Secondary | ICD-10-CM | POA: Diagnosis not present

## 2013-01-22 NOTE — Telephone Encounter (Signed)
Patient aware to continue Zoloft and monitor itching. As long as it doesn't worsen, she will be fine. Aware to call prn. Also, states has appointment with PCP coming up and will address if symptoms persist.

## 2013-02-09 DIAGNOSIS — H43819 Vitreous degeneration, unspecified eye: Secondary | ICD-10-CM | POA: Diagnosis not present

## 2013-02-10 ENCOUNTER — Ambulatory Visit: Payer: Medicare Other | Admitting: Family Medicine

## 2013-02-10 ENCOUNTER — Ambulatory Visit (INDEPENDENT_AMBULATORY_CARE_PROVIDER_SITE_OTHER): Payer: Medicare Other | Admitting: Obstetrics & Gynecology

## 2013-02-10 ENCOUNTER — Encounter: Payer: Self-pay | Admitting: Obstetrics & Gynecology

## 2013-02-10 ENCOUNTER — Telehealth: Payer: Self-pay | Admitting: Obstetrics & Gynecology

## 2013-02-10 VITALS — BP 114/62 | HR 64 | Resp 16 | Ht 62.75 in | Wt 121.6 lb

## 2013-02-10 DIAGNOSIS — F411 Generalized anxiety disorder: Secondary | ICD-10-CM

## 2013-02-10 MED ORDER — DULOXETINE HCL 30 MG PO CPEP: 30.0000 mg | ORAL_CAPSULE | Freq: Every day | ORAL | Status: DC

## 2013-02-10 NOTE — Patient Instructions (Signed)
Please call if you have any new side effects with the Cymbalta

## 2013-02-10 NOTE — Progress Notes (Signed)
67 y.o. Married Caucasian female G0P0 here for follow up of anxiety.  Started 50mg  Zoloft.  She noticed after 3 to 4 days that she had itching.  Continued to take for total of 18 days but the itching worsened until she was "clawing" at her skin.  Took another 2 weeks until itching stopped after stopped medication.  She did feel the anxiety was much better and would really like to try something else.  Will try Cymbalta 30mg .  Common side effects discussed.    Saw Dr. Guadelupe Sabin, PCP, for discussion of cholesterol.  Was advised she didn't need to be on any medication.  Has f/u 6 months.  O: Healthy WD,WN female Affect: normal  A:Anxiety  P: Trial of Cymbalta 30mg  daily.  Samples given.    ~10 minutes spent with patient >50% of time was in face to face discussion of above.

## 2013-02-10 NOTE — Telephone Encounter (Signed)
Patient canceled upcoming reck lipid appointment 06/2013. Patient says she will have done at her PCP.

## 2013-03-16 DIAGNOSIS — R21 Rash and other nonspecific skin eruption: Secondary | ICD-10-CM | POA: Diagnosis not present

## 2013-03-16 DIAGNOSIS — D1801 Hemangioma of skin and subcutaneous tissue: Secondary | ICD-10-CM | POA: Diagnosis not present

## 2013-03-16 DIAGNOSIS — L821 Other seborrheic keratosis: Secondary | ICD-10-CM | POA: Diagnosis not present

## 2013-03-16 DIAGNOSIS — D239 Other benign neoplasm of skin, unspecified: Secondary | ICD-10-CM | POA: Diagnosis not present

## 2013-03-16 DIAGNOSIS — L738 Other specified follicular disorders: Secondary | ICD-10-CM | POA: Diagnosis not present

## 2013-03-19 ENCOUNTER — Other Ambulatory Visit: Payer: Self-pay | Admitting: Internal Medicine

## 2013-03-19 ENCOUNTER — Ambulatory Visit
Admission: RE | Admit: 2013-03-19 | Discharge: 2013-03-19 | Disposition: A | Discharge status: Home / Self Care | Payer: Medicare Other | Source: Ambulatory Visit | Attending: Internal Medicine | Admitting: Internal Medicine

## 2013-03-19 DIAGNOSIS — M549 Dorsalgia, unspecified: Secondary | ICD-10-CM | POA: Diagnosis not present

## 2013-03-19 DIAGNOSIS — M76899 Other specified enthesopathies of unspecified lower limb, excluding foot: Secondary | ICD-10-CM | POA: Diagnosis not present

## 2013-03-19 DIAGNOSIS — M546 Pain in thoracic spine: Secondary | ICD-10-CM | POA: Diagnosis not present

## 2013-03-19 DIAGNOSIS — G479 Sleep disorder, unspecified: Secondary | ICD-10-CM | POA: Diagnosis not present

## 2013-03-19 DIAGNOSIS — R32 Unspecified urinary incontinence: Secondary | ICD-10-CM | POA: Diagnosis not present

## 2013-03-19 DIAGNOSIS — Z1331 Encounter for screening for depression: Secondary | ICD-10-CM | POA: Diagnosis not present

## 2013-03-19 DIAGNOSIS — R5381 Other malaise: Secondary | ICD-10-CM | POA: Diagnosis not present

## 2013-03-19 DIAGNOSIS — Z0000 Encounter for general adult medical examination without abnormal findings: Secondary | ICD-10-CM | POA: Diagnosis not present

## 2013-03-26 DIAGNOSIS — H43819 Vitreous degeneration, unspecified eye: Secondary | ICD-10-CM | POA: Diagnosis not present

## 2013-03-26 DIAGNOSIS — H04129 Dry eye syndrome of unspecified lacrimal gland: Secondary | ICD-10-CM | POA: Diagnosis not present

## 2013-03-26 DIAGNOSIS — H33309 Unspecified retinal break, unspecified eye: Secondary | ICD-10-CM | POA: Diagnosis not present

## 2013-03-26 DIAGNOSIS — H2510 Age-related nuclear cataract, unspecified eye: Secondary | ICD-10-CM | POA: Diagnosis not present

## 2013-04-09 DIAGNOSIS — H33309 Unspecified retinal break, unspecified eye: Secondary | ICD-10-CM | POA: Diagnosis not present

## 2013-05-08 DIAGNOSIS — Z23 Encounter for immunization: Secondary | ICD-10-CM | POA: Diagnosis not present

## 2013-06-25 DIAGNOSIS — H6120 Impacted cerumen, unspecified ear: Secondary | ICD-10-CM | POA: Diagnosis not present

## 2013-07-03 ENCOUNTER — Other Ambulatory Visit: Payer: Medicare Other

## 2013-08-20 DIAGNOSIS — Z1211 Encounter for screening for malignant neoplasm of colon: Secondary | ICD-10-CM | POA: Diagnosis not present

## 2013-08-20 DIAGNOSIS — R1033 Periumbilical pain: Secondary | ICD-10-CM | POA: Diagnosis not present

## 2013-08-20 DIAGNOSIS — Z86010 Personal history of colonic polyps: Secondary | ICD-10-CM | POA: Diagnosis not present

## 2013-09-15 DIAGNOSIS — R143 Flatulence: Secondary | ICD-10-CM | POA: Diagnosis not present

## 2013-09-15 DIAGNOSIS — R141 Gas pain: Secondary | ICD-10-CM | POA: Diagnosis not present

## 2013-09-15 DIAGNOSIS — R1013 Epigastric pain: Secondary | ICD-10-CM | POA: Diagnosis not present

## 2013-09-15 DIAGNOSIS — R1011 Right upper quadrant pain: Secondary | ICD-10-CM | POA: Diagnosis not present

## 2013-10-21 DIAGNOSIS — Z1211 Encounter for screening for malignant neoplasm of colon: Secondary | ICD-10-CM | POA: Diagnosis not present

## 2013-10-21 DIAGNOSIS — R1033 Periumbilical pain: Secondary | ICD-10-CM | POA: Diagnosis not present

## 2013-10-21 DIAGNOSIS — K5730 Diverticulosis of large intestine without perforation or abscess without bleeding: Secondary | ICD-10-CM | POA: Diagnosis not present

## 2013-10-21 DIAGNOSIS — K319 Disease of stomach and duodenum, unspecified: Secondary | ICD-10-CM | POA: Diagnosis not present

## 2013-10-21 DIAGNOSIS — R112 Nausea with vomiting, unspecified: Secondary | ICD-10-CM | POA: Diagnosis not present

## 2013-11-05 ENCOUNTER — Other Ambulatory Visit: Payer: Self-pay

## 2013-11-05 DIAGNOSIS — Z9882 Breast implant status: Secondary | ICD-10-CM

## 2013-11-05 DIAGNOSIS — Z1231 Encounter for screening mammogram for malignant neoplasm of breast: Secondary | ICD-10-CM

## 2013-11-19 ENCOUNTER — Encounter (INDEPENDENT_AMBULATORY_CARE_PROVIDER_SITE_OTHER): Payer: Self-pay

## 2013-11-19 ENCOUNTER — Ambulatory Visit
Admission: RE | Admit: 2013-11-19 | Discharge: 2013-11-19 | Disposition: A | Discharge status: Home / Self Care | Payer: Medicare Other | Source: Ambulatory Visit

## 2013-11-19 DIAGNOSIS — Z1231 Encounter for screening mammogram for malignant neoplasm of breast: Secondary | ICD-10-CM | POA: Diagnosis not present

## 2013-11-19 DIAGNOSIS — Z9882 Breast implant status: Secondary | ICD-10-CM

## 2013-12-04 ENCOUNTER — Encounter: Payer: Self-pay | Admitting: Obstetrics & Gynecology

## 2013-12-04 ENCOUNTER — Ambulatory Visit (INDEPENDENT_AMBULATORY_CARE_PROVIDER_SITE_OTHER): Payer: Medicare Other | Admitting: Obstetrics & Gynecology

## 2013-12-04 VITALS — BP 110/68 | HR 60 | Resp 16 | Ht 62.25 in | Wt 120.2 lb

## 2013-12-04 DIAGNOSIS — Z01419 Encounter for gynecological examination (general) (routine) without abnormal findings: Secondary | ICD-10-CM | POA: Diagnosis not present

## 2013-12-04 DIAGNOSIS — N841 Polyp of cervix uteri: Secondary | ICD-10-CM

## 2013-12-04 DIAGNOSIS — Z0000 Encounter for general adult medical examination without abnormal findings: Secondary | ICD-10-CM

## 2013-12-04 DIAGNOSIS — M949 Disorder of cartilage, unspecified: Secondary | ICD-10-CM | POA: Diagnosis not present

## 2013-12-04 DIAGNOSIS — M899 Disorder of bone, unspecified: Secondary | ICD-10-CM | POA: Diagnosis not present

## 2013-12-04 DIAGNOSIS — Z124 Encounter for screening for malignant neoplasm of cervix: Secondary | ICD-10-CM | POA: Diagnosis not present

## 2013-12-04 DIAGNOSIS — M8580 Other specified disorders of bone density and structure, unspecified site: Secondary | ICD-10-CM

## 2013-12-04 LAB — POCT URINALYSIS DIPSTICK
Bilirubin, UA: NEGATIVE
Glucose, UA: NEGATIVE
Leukocytes, UA: NEGATIVE
Nitrite, UA: NEGATIVE
PROTEIN UA: NEGATIVE
RBC UA: NEGATIVE
UROBILINOGEN UA: NEGATIVE
pH, UA: 5

## 2013-12-04 MED ORDER — ESTRADIOL-NORETHINDRONE ACET 0.05-0.25 MG/DAY TD PTTW: 1.0000 | MEDICATED_PATCH | TRANSDERMAL | Status: DC

## 2013-12-04 NOTE — Progress Notes (Signed)
68 y.o. G0P0 MarriedCaucasianF here for annual exam.  She is interested in decreasing her HRT.  Current complaint of vaginal discharge for several months.  No odor.  Looks yellowish in her underwear.   Patient's last menstrual period was 07/24/1991.          Sexually active: yes  The current method of family planning is post menopausal status.    Exercising: yes  biking and walking Smoker:  no  Health Maintenance: Pap:  11/14/12 WNL/negative HR HPV History of abnormal Pap:  no MMG:  11/19/13 3D-normal Colonoscopy:  3/15 and endoscopy-repeat colonoscopy in 5 years BMD:   2/10-stable TDaP:  2005 Screening Labs: PCP, Hb today: PCP, Urine today: KETONES-moderate, PH-5   reports that she has never smoked. She has never used smokeless tobacco. She reports that she does not drink alcohol or use illicit drugs.  Past Medical History  Diagnosis Date  . Frozen shoulder 2010  . Menopause   . Endometriosis 3/96    bx neg, suspicious for endo  . Infertility, female   . Ulcer   . Diabetes mellitus without complication     Past Surgical History  Procedure Laterality Date  . Dilation and curettage of uterus  1993  . Appendectomy    . Other surgical history  3/07    excision fo cx granuloma tissue under anesthesia  . Tonsillectomy and adenoidectomy    . Augmentation mammaplasty  1975  . Pelvic laparoscopy  3/96    due to pelvic pain  . Colonoscopy      and endoscopy 2015    Current Outpatient Prescriptions  Medication Sig Dispense Refill  . Calcium 1500 MG tablet Take 1,500 mg by mouth daily.      . cholecalciferol (VITAMIN D) 1000 UNITS tablet Take 1,000 Units by mouth daily.      Marland Kitchen estradiol-norethindrone (COMBIPATCH) 0.05-0.25 MG/DAY Place 1 patch onto the skin 2 (two) times a week.  8 patch  11  . Multiple Vitamin (MULTIVITAMIN) tablet Take 1 tablet by mouth daily.      . DULoxetine (CYMBALTA) 30 MG capsule Take 1 capsule (30 mg total) by mouth daily.  30 capsule  12   No current  facility-administered medications for this visit.    Family History  Problem Relation Age of Onset  . Dementia Mother   . Breast cancer Paternal Grandmother     ROS:  Pertinent items are noted in HPI.  Otherwise, a comprehensive ROS was negative.  Exam:   BP 110/68  Pulse 60  Resp 16  Ht 5' 2.25" (1.581 m)  Wt 120 lb 3.2 oz (54.522 kg)  BMI 21.81 kg/m2  LMP 07/24/1991  Weight change: +4#  Height: 5' 2.25" (158.1 cm)  Ht Readings from Last 3 Encounters:  12/04/13 5' 2.25" (1.581 m)  02/10/13 5' 2.75" (1.594 m)  12/30/12 5' 2.5" (1.588 m)    General appearance: alert, cooperative and appears stated age Head: Normocephalic, without obvious abnormality, atraumatic Neck: no adenopathy, supple, symmetrical, trachea midline and thyroid normal to inspection and palpation Lungs: clear to auscultation bilaterally Breasts: normal appearance, no masses or tenderness, bilateral implants Heart: regular rate and rhythm Abdomen: soft, non-tender; bowel sounds normal; no masses,  no organomegaly Extremities: extremities normal, atraumatic, no cyanosis or edema Skin: Skin color, texture, turgor normal. No rashes or lesions Lymph nodes: Cervical, supraclavicular, and axillary nodes normal. No abnormal inguinal nodes palpated Neurologic: Grossly normal   Pelvic: External genitalia:  no lesions  Urethra:  normal appearing urethra with no masses, tenderness or lesions              Bartholins and Skenes: normal                 Vagina: normal appearing vagina with normal color and discharge, no lesions              Cervix: feathery, vascular, polyp appearing leasing at cervix              Pap taken: no Cervical polyp grasped and removed with one twist of polyp forceps.  Bleeding was present.  Silver nitrate used for excellent hemostasis.  Bimanual Exam: not done today due to bleeding with polyp removal  A:  Well Woman with normal exam Grade 3 breast density in pt with bilateral  implants PMP, on HRT Osteopenia Vaginal discharge  P:   Mammogram yearly.  3D d/w pt pap smear 2014.  No pap today Polyp to pathology BMD order placed return annually or prn  An After Visit Summary was printed and given to the patient.

## 2013-12-10 ENCOUNTER — Telehealth: Payer: Self-pay | Admitting: Emergency Medicine

## 2013-12-10 NOTE — Telephone Encounter (Signed)
Spoke with patient and message from Dr. Sabra Heck given.  Patient is agreeable. She states that discharge is about the same. She is scheduled for follow up 12/28/13 which is approximately 3 weeks, advised to keep appointment as scheduled.   Will close encounter.

## 2013-12-10 NOTE — Telephone Encounter (Signed)
Message copied by Michele Mcalpine on Thu Dec 10, 2013  2:43 PM ------      Message from: Megan Salon      Created: Wed Dec 09, 2013  8:30 AM       Please inform pt the biopsy of the tissue of the cervix showed only inflammation.  No abnormal cells.  She should have a recheck in about a month and I will look at the area again to see if anything else needs to be done. ------

## 2013-12-17 ENCOUNTER — Ambulatory Visit
Admission: RE | Admit: 2013-12-17 | Discharge: 2013-12-17 | Disposition: A | Discharge status: Home / Self Care | Payer: Medicare Other | Source: Ambulatory Visit | Attending: Obstetrics & Gynecology | Admitting: Obstetrics & Gynecology

## 2013-12-17 DIAGNOSIS — M8580 Other specified disorders of bone density and structure, unspecified site: Secondary | ICD-10-CM

## 2013-12-17 DIAGNOSIS — Z780 Asymptomatic menopausal state: Secondary | ICD-10-CM | POA: Diagnosis not present

## 2013-12-28 ENCOUNTER — Ambulatory Visit (INDEPENDENT_AMBULATORY_CARE_PROVIDER_SITE_OTHER): Payer: Medicare Other | Admitting: Obstetrics & Gynecology

## 2013-12-28 VITALS — BP 104/60 | HR 60 | Resp 16 | Wt 123.0 lb

## 2013-12-28 DIAGNOSIS — R143 Flatulence: Secondary | ICD-10-CM | POA: Diagnosis not present

## 2013-12-28 DIAGNOSIS — R141 Gas pain: Secondary | ICD-10-CM | POA: Diagnosis not present

## 2013-12-28 DIAGNOSIS — R142 Eructation: Secondary | ICD-10-CM

## 2013-12-28 DIAGNOSIS — R1909 Other intra-abdominal and pelvic swelling, mass and lump: Secondary | ICD-10-CM

## 2013-12-28 DIAGNOSIS — R140 Abdominal distension (gaseous): Secondary | ICD-10-CM

## 2013-12-28 DIAGNOSIS — N898 Other specified noninflammatory disorders of vagina: Secondary | ICD-10-CM | POA: Diagnosis not present

## 2013-12-28 DIAGNOSIS — R109 Unspecified abdominal pain: Secondary | ICD-10-CM | POA: Diagnosis not present

## 2013-12-28 NOTE — Progress Notes (Signed)
Subjective:     Patient ID: Alexandra Cain, female   DOB: December 23, 1945, 68 y.o.   MRN: [id number]  HPI 68 yo G0 MWF here for follow up after having polypoid lesion removed from cervix.  Pt reports she was not informed of testing results although triage nurse states she did inform pt via telephone.  When I told pt this, she states she just needs to hear it from me too as she is still anxious.  Reviewed pathology personally and showed her test results.  She states she bled for several days, lightly, then the discharge resolved and now has come back over the last week or so.  Not has heavy.  She is wearing a little mini pad with a little light yellowish discharge.  No odor.    Pt started cutting Combipatch in 1/2.  Having hot flashes but she feels they are managable for now, especially if they will get better.  She doesn't want to stop her HRT but be on the lowest dosage possible.    Continues to have issues with bloating.  Had colonoscopy and upper GI earlier this year.  Wants me to review results with her as she wasn't confident in what Dr. Lorie Apley office told her.  Showed her the scanned reports that are in [facility] personally.  She would like to have another ca-125 this year.  She understands the weaknesses of this test.  Review of Systems  All other systems reviewed and are negative.      Objective:   Physical Exam  Constitutional: She is oriented to person, place, and time. She appears well-developed and well-nourished.  Abdominal: Soft.  Genitourinary: There is no rash or tenderness on the right labia. There is no rash on the left labia. Cervix exhibits no friability (without any lesions.  posterior aspect of cervix is scarred to posterior aspect of vagina.). No signs of injury around the vagina. Vaginal discharge (consistent wtih atrophic changes) found.  Lymphadenopathy:       Right: No inguinal adenopathy present.       Left: No inguinal adenopathy present.  Neurological: She is alert and oriented  to person, place, and time.  Skin: Skin is warm and dry.  Psychiatric: She has a normal mood and affect.       Assessment:     Vaginal discharge  Atrophic changes Abdominal bloating and firmness, at times    Plan:     Pt will continue on 1/2 patch dosage.  Declines vaginal estrogen Ca-125 today.

## 2013-12-29 ENCOUNTER — Encounter: Payer: Self-pay | Admitting: Obstetrics & Gynecology

## 2013-12-29 LAB — CA 125: CA 125: 11.8 U/mL (ref 0.0–30.2)

## 2014-03-15 DIAGNOSIS — L719 Rosacea, unspecified: Secondary | ICD-10-CM | POA: Diagnosis not present

## 2014-03-15 DIAGNOSIS — L821 Other seborrheic keratosis: Secondary | ICD-10-CM | POA: Diagnosis not present

## 2014-03-15 DIAGNOSIS — L570 Actinic keratosis: Secondary | ICD-10-CM | POA: Diagnosis not present

## 2014-03-15 DIAGNOSIS — D1801 Hemangioma of skin and subcutaneous tissue: Secondary | ICD-10-CM | POA: Diagnosis not present

## 2014-03-16 DIAGNOSIS — H6120 Impacted cerumen, unspecified ear: Secondary | ICD-10-CM | POA: Diagnosis not present

## 2014-03-22 DIAGNOSIS — Z1331 Encounter for screening for depression: Secondary | ICD-10-CM | POA: Diagnosis not present

## 2014-03-22 DIAGNOSIS — G4700 Insomnia, unspecified: Secondary | ICD-10-CM | POA: Diagnosis not present

## 2014-03-22 DIAGNOSIS — L821 Other seborrheic keratosis: Secondary | ICD-10-CM | POA: Diagnosis not present

## 2014-03-22 DIAGNOSIS — E785 Hyperlipidemia, unspecified: Secondary | ICD-10-CM | POA: Diagnosis not present

## 2014-04-05 DIAGNOSIS — H04229 Epiphora due to insufficient drainage, unspecified lacrimal gland: Secondary | ICD-10-CM | POA: Diagnosis not present

## 2014-05-03 DIAGNOSIS — H5213 Myopia, bilateral: Secondary | ICD-10-CM | POA: Diagnosis not present

## 2014-05-03 DIAGNOSIS — H04221 Epiphora due to insufficient drainage, right lacrimal gland: Secondary | ICD-10-CM | POA: Diagnosis not present

## 2014-05-03 DIAGNOSIS — H04222 Epiphora due to insufficient drainage, left lacrimal gland: Secondary | ICD-10-CM | POA: Diagnosis not present

## 2014-05-18 DIAGNOSIS — H5712 Ocular pain, left eye: Secondary | ICD-10-CM | POA: Diagnosis not present

## 2014-06-03 DIAGNOSIS — Z23 Encounter for immunization: Secondary | ICD-10-CM | POA: Diagnosis not present

## 2014-06-07 DIAGNOSIS — H11431 Conjunctival hyperemia, right eye: Secondary | ICD-10-CM | POA: Diagnosis not present

## 2014-06-07 DIAGNOSIS — H04223 Epiphora due to insufficient drainage, bilateral lacrimal glands: Secondary | ICD-10-CM | POA: Diagnosis not present

## 2014-06-07 DIAGNOSIS — H02532 Eyelid retraction right lower eyelid: Secondary | ICD-10-CM | POA: Diagnosis not present

## 2014-06-07 DIAGNOSIS — H11433 Conjunctival hyperemia, bilateral: Secondary | ICD-10-CM | POA: Diagnosis not present

## 2014-07-20 DIAGNOSIS — L308 Other specified dermatitis: Secondary | ICD-10-CM | POA: Diagnosis not present

## 2014-07-20 DIAGNOSIS — L821 Other seborrheic keratosis: Secondary | ICD-10-CM | POA: Diagnosis not present

## 2014-09-20 DIAGNOSIS — H02532 Eyelid retraction right lower eyelid: Secondary | ICD-10-CM | POA: Diagnosis not present

## 2014-09-20 DIAGNOSIS — H04223 Epiphora due to insufficient drainage, bilateral lacrimal glands: Secondary | ICD-10-CM | POA: Diagnosis not present

## 2014-09-30 ENCOUNTER — Telehealth: Payer: Self-pay | Admitting: Obstetrics & Gynecology

## 2014-09-30 NOTE — Telephone Encounter (Signed)
Spoke with patient. Patient states that she received a letter in the mail from her insurance company stating the prior authorization for her combipatch has been denied. "I spoke with the pharmacist on the paper they sent me and she recommended Paxil. She told me the estrogen in women over 84 has been linked to dementia. I really want to stay away from it now but need something for my night sweats and hot flashes. I want Dr.Miller's opinion." Advised patient that I would speak with Dr.Miller regarding denial of PA and medication change and return call with further information. Patient is agreeable.

## 2014-09-30 NOTE — Telephone Encounter (Signed)
Absolutely fine to try Paxil 10mg  daily.  Ok to send rx to pharmacy.  #30/2RF.  Pt should give update in 4-6 weeks.

## 2014-09-30 NOTE — Telephone Encounter (Signed)
Pt states she is having insurance issues with her combipatch prescription Pt requests alternative - states pharmacy gave her the name paxil but pt it unsure and would like to discuss before having rx sent in.  Pharmacy [facility]

## 2014-10-01 MED ORDER — PAROXETINE HCL 10 MG PO TABS: 10.0000 mg | ORAL_TABLET | Freq: Every day | ORAL | Status: DC

## 2014-10-01 NOTE — Telephone Encounter (Signed)
Spoke with patient. Advised of message as seen below from [place]. Patient is agreeable. Paxil 10mg  #30 2RF sent to [facility]. Patient is agreeable. Will call with update in 4-6 weeks or if she has any questions or concerns.  Routing to provider for final review. Patient agreeable to disposition. Will close encounter

## 2014-10-19 DIAGNOSIS — R51 Headache: Secondary | ICD-10-CM | POA: Diagnosis not present

## 2014-10-19 DIAGNOSIS — R42 Dizziness and giddiness: Secondary | ICD-10-CM | POA: Diagnosis not present

## 2014-10-19 DIAGNOSIS — H532 Diplopia: Secondary | ICD-10-CM | POA: Diagnosis not present

## 2014-10-25 DIAGNOSIS — L905 Scar conditions and fibrosis of skin: Secondary | ICD-10-CM | POA: Diagnosis not present

## 2014-10-25 DIAGNOSIS — H02532 Eyelid retraction right lower eyelid: Secondary | ICD-10-CM | POA: Diagnosis not present

## 2014-10-25 DIAGNOSIS — H04561 Stenosis of right lacrimal punctum: Secondary | ICD-10-CM | POA: Diagnosis not present

## 2014-10-25 DIAGNOSIS — H04223 Epiphora due to insufficient drainage, bilateral lacrimal glands: Secondary | ICD-10-CM | POA: Diagnosis not present

## 2014-11-02 DIAGNOSIS — H02122 Mechanical ectropion of right lower eyelid: Secondary | ICD-10-CM | POA: Diagnosis not present

## 2014-11-02 DIAGNOSIS — H02812 Retained foreign body in right lower eyelid: Secondary | ICD-10-CM | POA: Diagnosis not present

## 2014-11-02 HISTORY — PX: EYE SURGERY: SHX253

## 2014-12-06 ENCOUNTER — Ambulatory Visit (INDEPENDENT_AMBULATORY_CARE_PROVIDER_SITE_OTHER): Payer: Medicare Other | Admitting: Obstetrics & Gynecology

## 2014-12-06 ENCOUNTER — Encounter: Payer: Self-pay | Admitting: Obstetrics & Gynecology

## 2014-12-06 VITALS — BP 132/68 | HR 60 | Resp 16 | Ht 62.5 in | Wt 119.6 lb

## 2014-12-06 DIAGNOSIS — Z124 Encounter for screening for malignant neoplasm of cervix: Secondary | ICD-10-CM

## 2014-12-06 DIAGNOSIS — R1909 Other intra-abdominal and pelvic swelling, mass and lump: Secondary | ICD-10-CM

## 2014-12-06 DIAGNOSIS — R1084 Generalized abdominal pain: Secondary | ICD-10-CM

## 2014-12-06 DIAGNOSIS — R1907 Generalized intra-abdominal and pelvic swelling, mass and lump: Secondary | ICD-10-CM | POA: Diagnosis not present

## 2014-12-06 DIAGNOSIS — Z01419 Encounter for gynecological examination (general) (routine) without abnormal findings: Secondary | ICD-10-CM

## 2014-12-06 DIAGNOSIS — R1905 Periumbilic swelling, mass or lump: Secondary | ICD-10-CM | POA: Diagnosis not present

## 2014-12-06 DIAGNOSIS — Z23 Encounter for immunization: Secondary | ICD-10-CM

## 2014-12-06 NOTE — Progress Notes (Signed)
68 y.o. G0P0 MarriedCaucasianF here for annual exam.  Pt stopped Combipatch completely in March due to cost.  Tried Paxil but she had side effects so she stopped this.  Is having increased hot flashes.  Really doesn't want to be back on HRT if possible.  Pt with long hx of intermittent bowel/abdominal pain and distention that is most likely bowel related.  Pt having recent increased issues with this.  Feels worse right around her belly button.  Always has a little anxiety about ovarian cancer.  Has done ca-125 in the past and would like to do another one, if appropriate.    PCP:  [facility]  Patient's last menstrual period was 07/24/1991.          Sexually active: Yes.    The current method of family planning is post menopausal status.    Exercising: Yes.    biking Smoker:  no  Health Maintenance: Pap:  11/14/12 WNL/negative HR HPV History of abnormal Pap:  no MMG:  11/19/13 3D-normal Colonoscopy:  3/15 and endoscopy-repeat in 5 years BMD:   12/17/13-normal, -0.8/-0.5 TDaP:  2005 Screening Labs: PCP, Hb today: PCP, Urine today: PCP   reports that she has never smoked. She has never used smokeless tobacco. She reports that she does not drink alcohol or use illicit drugs.  Past Medical History  Diagnosis Date  . Frozen shoulder 2010  . Menopause   . Endometriosis 3/96    bx neg, suspicious for endo  . Infertility, female   . Ulcer     Past Surgical History  Procedure Laterality Date  . Dilation and curettage of uterus  1993  . Appendectomy    . Other surgical history  3/07    excision fo cx granuloma tissue under anesthesia  . Tonsillectomy and adenoidectomy    . Augmentation mammaplasty  1975  . Pelvic laparoscopy  3/96    due to pelvic pain  . Eye surgery  11/02/14    Family History  Problem Relation Age of Onset  . Dementia Mother   . Breast cancer Paternal Grandmother     ROS:  Pertinent items are noted in HPI.  Otherwise, a comprehensive ROS was  negative.  Exam:   BP 132/68 mmHg  Pulse 60  Resp 16  Ht 5' 2.5" (1.588 m)  Wt 119 lb 9.6 oz (54.25 kg)  BMI 21.51 kg/m2  LMP 07/24/1991  Height: 5' 2.5" (158.8 cm)  Ht Readings from Last 3 Encounters:  12/06/14 5' 2.5" (1.588 m)  12/04/13 5' 2.25" (1.581 m)  02/10/13 5' 2.75" (1.594 m)    General appearance: alert, cooperative and appears stated age Head: Normocephalic, without obvious abnormality, atraumatic Neck: no adenopathy, supple, symmetrical, trachea midline and thyroid normal to inspection and palpation Lungs: clear to auscultation bilaterally Breasts: normal appearance, no masses or tenderness Heart: regular rate and rhythm Abdomen: soft, non-tender; bowel sounds normal; no masses,  no organomegaly Extremities: extremities normal, atraumatic, no cyanosis or edema Skin: Skin color, texture, turgor normal. No rashes or lesions Lymph nodes: Cervical, supraclavicular, and axillary nodes normal. No abnormal inguinal nodes palpated Neurologic: Grossly normal   Pelvic: External genitalia:  no lesions              Urethra:  normal appearing urethra with no masses, tenderness or lesions              Bartholins and Skenes: normal                   Vagina: scarring of vaginal apex, tight--has been this way since I first met pt              Cervix: no lesions              Pap taken: Yes.   Bimanual Exam:  Uterus:  normal size, contour, position, consistency, mobility, non-tender              Adnexa: normal adnexa and no mass, fullness, tenderness               Rectovaginal: Confirms               Anus:  normal sphincter tone, no lesions  Chaperone was present for exam.  A:  Well Woman with normal exam Grade 3 breast density Bilateral implants PMP off HRT, now with hot flashes Osteopenia Vaginal discharge Chronic off/on abdominal pain with distension, worse around umbilicus as of late  P: Mammogram yearly pap smear 2014. Pap obtained today Tdap today Labs  with PCP Ca-125 today return annually or prn

## 2014-12-06 NOTE — Patient Instructions (Signed)
Try Estroven PM for the night sweats.

## 2014-12-07 DIAGNOSIS — R1907 Generalized intra-abdominal and pelvic swelling, mass and lump: Secondary | ICD-10-CM | POA: Diagnosis not present

## 2014-12-07 DIAGNOSIS — R1909 Other intra-abdominal and pelvic swelling, mass and lump: Secondary | ICD-10-CM | POA: Diagnosis not present

## 2014-12-07 DIAGNOSIS — R1084 Generalized abdominal pain: Secondary | ICD-10-CM | POA: Diagnosis not present

## 2014-12-07 LAB — IPS PAP SMEAR ONLY

## 2014-12-08 LAB — CA 125: CA 125: 19 U/mL (ref <35)

## 2014-12-21 ENCOUNTER — Other Ambulatory Visit: Payer: Self-pay

## 2014-12-21 DIAGNOSIS — Z1231 Encounter for screening mammogram for malignant neoplasm of breast: Secondary | ICD-10-CM

## 2015-01-06 ENCOUNTER — Ambulatory Visit
Admission: RE | Admit: 2015-01-06 | Discharge: 2015-01-06 | Disposition: A | Discharge status: Home / Self Care | Payer: Medicare Other | Source: Ambulatory Visit

## 2015-01-06 ENCOUNTER — Encounter (INDEPENDENT_AMBULATORY_CARE_PROVIDER_SITE_OTHER): Payer: Self-pay

## 2015-01-06 DIAGNOSIS — Z1231 Encounter for screening mammogram for malignant neoplasm of breast: Secondary | ICD-10-CM

## 2015-04-07 DIAGNOSIS — R6889 Other general symptoms and signs: Secondary | ICD-10-CM | POA: Diagnosis not present

## 2015-04-07 DIAGNOSIS — S00422A Blister (nonthermal) of left ear, initial encounter: Secondary | ICD-10-CM | POA: Diagnosis not present

## 2015-04-07 DIAGNOSIS — H9201 Otalgia, right ear: Secondary | ICD-10-CM | POA: Diagnosis not present

## 2015-04-07 DIAGNOSIS — Z23 Encounter for immunization: Secondary | ICD-10-CM | POA: Diagnosis not present

## 2015-04-07 DIAGNOSIS — Z0000 Encounter for general adult medical examination without abnormal findings: Secondary | ICD-10-CM | POA: Diagnosis not present

## 2015-04-07 DIAGNOSIS — M722 Plantar fascial fibromatosis: Secondary | ICD-10-CM | POA: Diagnosis not present

## 2015-04-07 DIAGNOSIS — H6123 Impacted cerumen, bilateral: Secondary | ICD-10-CM | POA: Diagnosis not present

## 2015-04-07 DIAGNOSIS — E785 Hyperlipidemia, unspecified: Secondary | ICD-10-CM | POA: Diagnosis not present

## 2015-04-07 DIAGNOSIS — D2322 Other benign neoplasm of skin of left ear and external auricular canal: Secondary | ICD-10-CM | POA: Diagnosis not present

## 2015-04-07 DIAGNOSIS — Z1389 Encounter for screening for other disorder: Secondary | ICD-10-CM | POA: Diagnosis not present

## 2015-04-22 ENCOUNTER — Other Ambulatory Visit: Payer: Self-pay | Admitting: Internal Medicine

## 2015-04-22 ENCOUNTER — Ambulatory Visit
Admission: RE | Admit: 2015-04-22 | Discharge: 2015-04-22 | Disposition: A | Discharge status: Home / Self Care | Payer: Medicare Other | Source: Ambulatory Visit | Attending: Internal Medicine | Admitting: Internal Medicine

## 2015-04-22 DIAGNOSIS — R0789 Other chest pain: Secondary | ICD-10-CM | POA: Diagnosis not present

## 2015-04-22 DIAGNOSIS — Z01818 Encounter for other preprocedural examination: Secondary | ICD-10-CM

## 2015-04-22 DIAGNOSIS — F439 Reaction to severe stress, unspecified: Secondary | ICD-10-CM | POA: Diagnosis not present

## 2015-04-22 DIAGNOSIS — R918 Other nonspecific abnormal finding of lung field: Secondary | ICD-10-CM | POA: Diagnosis not present

## 2015-04-22 DIAGNOSIS — E785 Hyperlipidemia, unspecified: Secondary | ICD-10-CM | POA: Diagnosis not present

## 2015-04-22 DIAGNOSIS — L720 Epidermal cyst: Secondary | ICD-10-CM | POA: Diagnosis not present

## 2015-04-23 HISTORY — PX: CYST EXCISION: SHX5701

## 2015-04-25 DIAGNOSIS — L812 Freckles: Secondary | ICD-10-CM | POA: Diagnosis not present

## 2015-04-25 DIAGNOSIS — L821 Other seborrheic keratosis: Secondary | ICD-10-CM | POA: Diagnosis not present

## 2015-04-25 DIAGNOSIS — D225 Melanocytic nevi of trunk: Secondary | ICD-10-CM | POA: Diagnosis not present

## 2015-04-25 DIAGNOSIS — D1801 Hemangioma of skin and subcutaneous tissue: Secondary | ICD-10-CM | POA: Diagnosis not present

## 2015-04-26 ENCOUNTER — Other Ambulatory Visit: Payer: Self-pay | Admitting: Internal Medicine

## 2015-04-26 DIAGNOSIS — R079 Chest pain, unspecified: Secondary | ICD-10-CM | POA: Diagnosis not present

## 2015-04-26 DIAGNOSIS — R918 Other nonspecific abnormal finding of lung field: Secondary | ICD-10-CM

## 2015-04-26 DIAGNOSIS — R0602 Shortness of breath: Secondary | ICD-10-CM | POA: Diagnosis not present

## 2015-04-26 DIAGNOSIS — R0789 Other chest pain: Secondary | ICD-10-CM | POA: Diagnosis not present

## 2015-04-26 DIAGNOSIS — E785 Hyperlipidemia, unspecified: Secondary | ICD-10-CM | POA: Diagnosis not present

## 2015-04-28 DIAGNOSIS — R079 Chest pain, unspecified: Secondary | ICD-10-CM | POA: Diagnosis not present

## 2015-04-29 ENCOUNTER — Ambulatory Visit
Admission: RE | Admit: 2015-04-29 | Discharge: 2015-04-29 | Disposition: A | Discharge status: Home / Self Care | Payer: Medicare Other | Source: Ambulatory Visit | Attending: Internal Medicine | Admitting: Internal Medicine

## 2015-04-29 DIAGNOSIS — R918 Other nonspecific abnormal finding of lung field: Secondary | ICD-10-CM | POA: Diagnosis not present

## 2015-05-09 DIAGNOSIS — H2513 Age-related nuclear cataract, bilateral: Secondary | ICD-10-CM | POA: Diagnosis not present

## 2015-05-09 DIAGNOSIS — H5213 Myopia, bilateral: Secondary | ICD-10-CM | POA: Diagnosis not present

## 2015-05-12 DIAGNOSIS — Z23 Encounter for immunization: Secondary | ICD-10-CM | POA: Diagnosis not present

## 2015-05-17 DIAGNOSIS — D2322 Other benign neoplasm of skin of left ear and external auricular canal: Secondary | ICD-10-CM | POA: Diagnosis not present

## 2015-05-17 DIAGNOSIS — H6123 Impacted cerumen, bilateral: Secondary | ICD-10-CM | POA: Diagnosis not present

## 2015-05-17 DIAGNOSIS — H61392 Other acquired stenosis of left external ear canal: Secondary | ICD-10-CM | POA: Diagnosis not present

## 2015-05-17 DIAGNOSIS — H903 Sensorineural hearing loss, bilateral: Secondary | ICD-10-CM | POA: Diagnosis not present

## 2015-05-20 ENCOUNTER — Other Ambulatory Visit: Payer: Self-pay | Admitting: Otolaryngology

## 2015-05-20 DIAGNOSIS — H61392 Other acquired stenosis of left external ear canal: Secondary | ICD-10-CM | POA: Diagnosis not present

## 2015-05-20 DIAGNOSIS — D2322 Other benign neoplasm of skin of left ear and external auricular canal: Secondary | ICD-10-CM | POA: Diagnosis not present

## 2015-05-20 DIAGNOSIS — Q181 Preauricular sinus and cyst: Secondary | ICD-10-CM | POA: Diagnosis not present

## 2015-07-07 DIAGNOSIS — H9201 Otalgia, right ear: Secondary | ICD-10-CM | POA: Diagnosis not present

## 2015-07-07 DIAGNOSIS — H903 Sensorineural hearing loss, bilateral: Secondary | ICD-10-CM | POA: Diagnosis not present

## 2015-07-11 ENCOUNTER — Other Ambulatory Visit: Payer: Self-pay | Admitting: Otolaryngology

## 2015-07-11 DIAGNOSIS — H9201 Otalgia, right ear: Secondary | ICD-10-CM

## 2015-07-14 ENCOUNTER — Other Ambulatory Visit: Payer: Medicare Other

## 2015-07-19 ENCOUNTER — Ambulatory Visit
Admission: RE | Admit: 2015-07-19 | Discharge: 2015-07-19 | Disposition: A | Discharge status: Home / Self Care | Payer: Medicare Other | Source: Ambulatory Visit | Attending: Otolaryngology | Admitting: Otolaryngology

## 2015-07-19 DIAGNOSIS — H9201 Otalgia, right ear: Secondary | ICD-10-CM | POA: Diagnosis not present

## 2015-07-19 MED ORDER — IOPAMIDOL (ISOVUE-300) INJECTION 61%
75.0000 mL | Freq: Once | INTRAVENOUS | Status: AC | PRN
  Administered 2015-07-19: 75 mL via INTRAVENOUS

## 2015-07-27 ENCOUNTER — Telehealth: Payer: Self-pay | Admitting: Obstetrics & Gynecology

## 2015-07-27 NOTE — Telephone Encounter (Signed)
Patient called and said, "I need some help. I am having hot flashes 24/7. I'd like to speak with the nurse." Pharmacy on file is correct, if needed.

## 2015-07-27 NOTE — Telephone Encounter (Signed)
She can take 0.5mg  Estradiol and 2.5mg  provera.  Ok to send to pharmacy.  She and I have discussed risks many times over the past several years.

## 2015-07-27 NOTE — Telephone Encounter (Signed)
Spoke with patient. Patient states that she came off of her Combipatch last May due to cost of rx. Patient has been trying OTC herbal medications without relief. Patient states that she is having increased hot flashes and night sweats. "My quality of life is terrible. I can not sleep. I am not even wearing winter clothes because I sweat so much." Patient is requesting to restart the Combipatch stating she will pay OOP for the rx. Or asking for an alternative that may be cheaper for her. Advised I will speak with Dr.Miller and return call with further recommendations. Patient is agreeable.

## 2015-07-28 MED ORDER — ESTRADIOL 0.5 MG PO TABS: 0.5000 mg | ORAL_TABLET | Freq: Every day | ORAL | Status: DC

## 2015-07-28 MED ORDER — MEDROXYPROGESTERONE ACETATE 2.5 MG PO TABS: 2.5000 mg | ORAL_TABLET | Freq: Every day | ORAL | Status: DC

## 2015-07-28 NOTE — Telephone Encounter (Signed)
Spoke with patient. Advised of message as seen below from [place]. Patient is agreeable. Rx for Estradiol 0.5 mg take one tablet daily #30 0RF and Provera 2.5 mg take one tablet daily #30 0RF sent to [facility] off [address] per patient request. Patient would like to try one month at this time and return call with an update on how she is doing for further refills or adjustments to the medication.  Routing to provider for final review. Patient agreeable to disposition. Will close encounter.

## 2015-08-23 ENCOUNTER — Other Ambulatory Visit: Payer: Self-pay | Admitting: Obstetrics & Gynecology

## 2015-08-23 MED ORDER — MEDROXYPROGESTERONE ACETATE 2.5 MG PO TABS: 2.5000 mg | ORAL_TABLET | Freq: Every day | ORAL | Status: DC

## 2015-08-23 NOTE — Telephone Encounter (Signed)
Medication refill request: Estradiol and Provera  Last AEX:  12-06-14  Next AEX: 02-17-16 Last MMG (if hormonal medication request): 01-06-15 WNL  Refill authorized: please advise   I spoke with patient in regards to how she was doing on the meds and she said "they are a miracle drug". She wishes to stay on them at this time and would like 90 day refills.

## 2015-08-24 ENCOUNTER — Other Ambulatory Visit: Payer: Self-pay | Admitting: Obstetrics & Gynecology

## 2015-08-24 NOTE — Telephone Encounter (Signed)
Medication refill request:Provera   Refill authorized: 08/23/2015 # 90 tabs 0 Refills  Today Refused

## 2015-08-25 ENCOUNTER — Other Ambulatory Visit: Payer: Self-pay | Admitting: Obstetrics & Gynecology

## 2015-09-02 DIAGNOSIS — L720 Epidermal cyst: Secondary | ICD-10-CM | POA: Diagnosis not present

## 2015-09-21 HISTORY — PX: CYST EXCISION: SHX5701

## 2015-09-22 DIAGNOSIS — L986 Other infiltrative disorders of the skin and subcutaneous tissue: Secondary | ICD-10-CM | POA: Diagnosis not present

## 2015-09-22 DIAGNOSIS — L720 Epidermal cyst: Secondary | ICD-10-CM | POA: Diagnosis not present

## 2015-09-22 DIAGNOSIS — D485 Neoplasm of uncertain behavior of skin: Secondary | ICD-10-CM | POA: Diagnosis not present

## 2015-10-10 DIAGNOSIS — Z79899 Other long term (current) drug therapy: Secondary | ICD-10-CM | POA: Diagnosis not present

## 2015-11-14 ENCOUNTER — Other Ambulatory Visit: Payer: Self-pay | Admitting: Obstetrics & Gynecology

## 2015-11-14 NOTE — Telephone Encounter (Signed)
Medication refill request: Estradiol 0.5 mg  ; Provera 2.5 mg  Last AEX: 12/06/14 with SM  Next AEX: 02/17/2016 with SM  Last MMG (if hormonal medication request): 01/06/15 bi-rads 2: benign  Refill authorized: Please advise  Routed to Dr. Quincy Simmonds since Dr. Sabra Heck is out of the office this week.

## 2015-11-17 ENCOUNTER — Other Ambulatory Visit: Payer: Self-pay | Admitting: Obstetrics & Gynecology

## 2015-11-17 NOTE — Telephone Encounter (Signed)
Medication refill request: medroxyPROGESTERone 2.5mg  Last AEX:  12/06/14 w/ MSM Next AEX: 02/17/16 w/ MSM Last MMG (if hormonal medication request): 01/06/15 bi-rads 2: benign  Refill authorized: Medication refilled by Dr. Quincy Simmonds in Dr. Ammie Ferrier absence on 11/14/15

## 2015-12-02 ENCOUNTER — Other Ambulatory Visit: Payer: Self-pay

## 2015-12-02 DIAGNOSIS — Z1231 Encounter for screening mammogram for malignant neoplasm of breast: Secondary | ICD-10-CM

## 2016-01-06 ENCOUNTER — Other Ambulatory Visit: Payer: Self-pay | Admitting: Internal Medicine

## 2016-01-06 ENCOUNTER — Other Ambulatory Visit: Payer: Self-pay | Admitting: Geriatric Medicine

## 2016-01-06 DIAGNOSIS — R911 Solitary pulmonary nodule: Secondary | ICD-10-CM

## 2016-01-09 DIAGNOSIS — L720 Epidermal cyst: Secondary | ICD-10-CM | POA: Diagnosis not present

## 2016-01-09 DIAGNOSIS — L821 Other seborrheic keratosis: Secondary | ICD-10-CM | POA: Diagnosis not present

## 2016-01-09 DIAGNOSIS — L820 Inflamed seborrheic keratosis: Secondary | ICD-10-CM | POA: Diagnosis not present

## 2016-01-16 ENCOUNTER — Ambulatory Visit
Admission: RE | Admit: 2016-01-16 | Discharge: 2016-01-16 | Disposition: A | Discharge status: Home / Self Care | Payer: Medicare Other | Source: Ambulatory Visit | Attending: Internal Medicine | Admitting: Internal Medicine

## 2016-01-16 DIAGNOSIS — R911 Solitary pulmonary nodule: Secondary | ICD-10-CM

## 2016-01-17 ENCOUNTER — Ambulatory Visit
Admission: RE | Admit: 2016-01-17 | Discharge: 2016-01-17 | Disposition: A | Discharge status: Home / Self Care | Payer: Medicare Other | Source: Ambulatory Visit

## 2016-01-17 DIAGNOSIS — Z1231 Encounter for screening mammogram for malignant neoplasm of breast: Secondary | ICD-10-CM | POA: Diagnosis not present

## 2016-02-17 ENCOUNTER — Encounter: Payer: Self-pay | Admitting: Obstetrics & Gynecology

## 2016-02-17 ENCOUNTER — Ambulatory Visit: Payer: Medicare Other | Admitting: Obstetrics & Gynecology

## 2016-02-17 NOTE — Progress Notes (Deleted)
70 y.o. G0P0 MarriedCaucasianF here for annual exam.    Patient's last menstrual period was 07/24/1991.          Sexually active: {yes no:314532}  The current method of family planning is post menopausal status.    Exercising: {yes no:314532}  {types:19826} Smoker:  {YES P5382123  Health Maintenance: Pap:  12/06/14 Neg. 11/14/12 Neg. HR HPV:neg History of abnormal Pap:  no MMG:  01/20/16 BIRADS1:neg Colonoscopy:  10/21/13 Normal - f/u 5 years  BMD:   12/17/13 Normal  TDaP:  11/2014  Pneumonia vaccine(s):  03/2011  Zostavax:   08/2006 Hep C testing: *** Screening Labs: ***, Hb today: ***, Urine today: ***   reports that she has never smoked. She has never used smokeless tobacco. She reports that she does not drink alcohol or use drugs.  Past Medical History:  Diagnosis Date  . Endometriosis 3/96   bx neg, suspicious for endo  . Frozen shoulder 2010  . Infertility, female   . Menopause   . Ulcer     Past Surgical History:  Procedure Laterality Date  . APPENDECTOMY    . AUGMENTATION MAMMAPLASTY  1975  . DILATION AND CURETTAGE OF UTERUS  1993  . EYE SURGERY  11/02/14  . OTHER SURGICAL HISTORY  3/07   excision fo cx granuloma tissue under anesthesia  . PELVIC LAPAROSCOPY  3/96   due to pelvic pain  . TONSILLECTOMY AND ADENOIDECTOMY      Current Outpatient Prescriptions  Medication Sig Dispense Refill  . aspirin 81 MG tablet Take 81 mg by mouth daily.    . cholecalciferol (VITAMIN D) 1000 UNITS tablet Take 1,000 Units by mouth daily.    Marland Kitchen estradiol (ESTRACE) 0.5 MG tablet TAKE ONE TABLET BY MOUTH ONCE DAILY 90 tablet 0  . medroxyPROGESTERone (PROVERA) 2.5 MG tablet TAKE ONE TABLET BY MOUTH DAILY. 90 tablet 0  . Multiple Vitamin (MULTIVITAMIN) tablet Take 1 tablet by mouth daily.    Marland Kitchen PARoxetine (PAXIL) 10 MG tablet Take 1 tablet (10 mg total) by mouth daily. (Patient not taking: Reported on 12/06/2014) 30 tablet 2   No current facility-administered medications for this visit.      Family History  Problem Relation Age of Onset  . Dementia Mother   . Breast cancer Paternal Grandmother     ROS:  Pertinent items are noted in HPI.  Otherwise, a comprehensive ROS was negative.  Exam:   LMP 07/24/1991   Weight change: @WEIGHTCHANGE @ Height:      Ht Readings from Last 3 Encounters:  12/06/14 5' 2.5" (1.588 m)  12/04/13 5' 2.25" (1.581 m)  02/10/13 5' 2.75" (1.594 m)    General appearance: alert, cooperative and appears stated age Head: Normocephalic, without obvious abnormality, atraumatic Neck: no adenopathy, supple, symmetrical, trachea midline and thyroid {EXAM; THYROID:18604} Lungs: clear to auscultation bilaterally Breasts: {Exam; breast:13139::"normal appearance, no masses or tenderness"} Heart: regular rate and rhythm Abdomen: soft, non-tender; bowel sounds normal; no masses,  no organomegaly Extremities: extremities normal, atraumatic, no cyanosis or edema Skin: Skin color, texture, turgor normal. No rashes or lesions Lymph nodes: Cervical, supraclavicular, and axillary nodes normal. No abnormal inguinal nodes palpated Neurologic: Grossly normal   Pelvic: External genitalia:  no lesions              Urethra:  normal appearing urethra with no masses, tenderness or lesions              Bartholins and Skenes: normal  Vagina: normal appearing vagina with normal color and discharge, no lesions              Cervix: {exam; cervix:14595}              Pap taken: {yes no:314532} Bimanual Exam:  Uterus:  {exam; uterus:12215}              Adnexa: {exam; adnexa:12223}               Rectovaginal: Confirms               Anus:  normal sphincter tone, no lesions  Chaperone was present for exam.  A:  Well Woman with normal exam  P:   {plan; gyn:5269::"mammogram","pap smear","return annually or prn"}

## 2016-02-18 ENCOUNTER — Other Ambulatory Visit: Payer: Self-pay | Admitting: Obstetrics and Gynecology

## 2016-02-20 NOTE — Telephone Encounter (Signed)
Called pharmacy. Rx for Provera changed to #30 tabs/0R. Per Dr. Sabra Heck.

## 2016-02-20 NOTE — Telephone Encounter (Signed)
RFs will be done for #30 days only.  Pt missed 02/17/16 appt--was a DNKA.  Last AEX was 5/16.  No more RFs until exam.

## 2016-02-20 NOTE — Telephone Encounter (Signed)
Medication refill request: estadiol Last AEX:  02/17/16 SM Next AEX: Not scheduled  Last MMG (if hormonal medication request): 01/17/16 BIRADS1 Refill authorized: 11/14/15 #90 0R. Please advise. Thankyou.   Medication refill request: Medroxyprogesterone Last AEX:  02/17/16 SM Next AEX: Not scheduled Last MMG (if hormonal medication request): 01/17/16 BIRADS1 Refill authorized: 11/14/15 #90 0R. Please advise. Thankyou.

## 2016-02-22 ENCOUNTER — Other Ambulatory Visit: Payer: Self-pay | Admitting: Obstetrics and Gynecology

## 2016-03-23 ENCOUNTER — Other Ambulatory Visit: Payer: Self-pay | Admitting: *Deleted

## 2016-03-23 MED ORDER — MEDROXYPROGESTERONE ACETATE 2.5 MG PO TABS: 2.5000 mg | ORAL_TABLET | Freq: Every day | ORAL | 0 refills | Status: DC

## 2016-03-23 MED ORDER — ESTRADIOL 0.5 MG PO TABS: 0.5000 mg | ORAL_TABLET | Freq: Every day | ORAL | 0 refills | Status: DC

## 2016-03-23 NOTE — Telephone Encounter (Signed)
Medication refill request: Estrace and Provera 2.5mg  Last AEX:  12/06/14 Next AEX: 04/30/16 SM  Last MMG (if hormonal medication request): 01/20/16 BIRADS1:neg  Refill authorized: Both 02/20/16 Estrace #3-tabs/0R. Provera #90tabs/0R. Today please advise

## 2016-03-29 DIAGNOSIS — H903 Sensorineural hearing loss, bilateral: Secondary | ICD-10-CM | POA: Diagnosis not present

## 2016-03-29 DIAGNOSIS — H9201 Otalgia, right ear: Secondary | ICD-10-CM | POA: Diagnosis not present

## 2016-03-29 DIAGNOSIS — H6123 Impacted cerumen, bilateral: Secondary | ICD-10-CM | POA: Diagnosis not present

## 2016-04-09 ENCOUNTER — Other Ambulatory Visit: Payer: Self-pay

## 2016-04-09 DIAGNOSIS — E785 Hyperlipidemia, unspecified: Secondary | ICD-10-CM | POA: Diagnosis not present

## 2016-04-09 DIAGNOSIS — M8580 Other specified disorders of bone density and structure, unspecified site: Secondary | ICD-10-CM

## 2016-04-09 DIAGNOSIS — R911 Solitary pulmonary nodule: Secondary | ICD-10-CM | POA: Diagnosis not present

## 2016-04-09 DIAGNOSIS — Z23 Encounter for immunization: Secondary | ICD-10-CM | POA: Diagnosis not present

## 2016-04-09 DIAGNOSIS — Z780 Asymptomatic menopausal state: Secondary | ICD-10-CM | POA: Diagnosis not present

## 2016-04-09 DIAGNOSIS — R938 Abnormal findings on diagnostic imaging of other specified body structures: Secondary | ICD-10-CM | POA: Diagnosis not present

## 2016-04-09 DIAGNOSIS — Z1211 Encounter for screening for malignant neoplasm of colon: Secondary | ICD-10-CM | POA: Diagnosis not present

## 2016-04-09 DIAGNOSIS — Z0000 Encounter for general adult medical examination without abnormal findings: Secondary | ICD-10-CM | POA: Diagnosis not present

## 2016-04-24 ENCOUNTER — Other Ambulatory Visit: Payer: Medicare Other

## 2016-04-26 DIAGNOSIS — I788 Other diseases of capillaries: Secondary | ICD-10-CM | POA: Diagnosis not present

## 2016-04-26 DIAGNOSIS — L570 Actinic keratosis: Secondary | ICD-10-CM | POA: Diagnosis not present

## 2016-04-26 DIAGNOSIS — L821 Other seborrheic keratosis: Secondary | ICD-10-CM | POA: Diagnosis not present

## 2016-04-26 DIAGNOSIS — D1801 Hemangioma of skin and subcutaneous tissue: Secondary | ICD-10-CM | POA: Diagnosis not present

## 2016-04-30 ENCOUNTER — Encounter: Payer: Self-pay | Admitting: Obstetrics & Gynecology

## 2016-04-30 ENCOUNTER — Ambulatory Visit (INDEPENDENT_AMBULATORY_CARE_PROVIDER_SITE_OTHER): Payer: Medicare Other | Admitting: Obstetrics & Gynecology

## 2016-04-30 VITALS — BP 120/76 | HR 88 | Resp 16 | Ht 62.5 in | Wt 121.0 lb

## 2016-04-30 DIAGNOSIS — Z01419 Encounter for gynecological examination (general) (routine) without abnormal findings: Secondary | ICD-10-CM | POA: Diagnosis not present

## 2016-04-30 DIAGNOSIS — R1909 Other intra-abdominal and pelvic swelling, mass and lump: Secondary | ICD-10-CM | POA: Diagnosis not present

## 2016-04-30 DIAGNOSIS — Z0000 Encounter for general adult medical examination without abnormal findings: Secondary | ICD-10-CM

## 2016-04-30 DIAGNOSIS — R1907 Generalized intra-abdominal and pelvic swelling, mass and lump: Secondary | ICD-10-CM | POA: Diagnosis not present

## 2016-04-30 DIAGNOSIS — Z124 Encounter for screening for malignant neoplasm of cervix: Secondary | ICD-10-CM

## 2016-04-30 DIAGNOSIS — Z205 Contact with and (suspected) exposure to viral hepatitis: Secondary | ICD-10-CM

## 2016-04-30 LAB — POCT URINALYSIS DIPSTICK
Bilirubin, UA: NEGATIVE
Glucose, UA: NEGATIVE
KETONES UA: NEGATIVE
Leukocytes, UA: NEGATIVE
Nitrite, UA: NEGATIVE
PH UA: 5
PROTEIN UA: NEGATIVE
RBC UA: NEGATIVE
UROBILINOGEN UA: NEGATIVE

## 2016-04-30 MED ORDER — HYDROCORTISONE 2.5 % RE CREA: 1.0000 "application " | TOPICAL_CREAM | RECTAL | 1 refills | Status: DC

## 2016-04-30 MED ORDER — ESTRADIOL-NORETHINDRONE ACET 0.05-0.25 MG/DAY TD PTTW: 1.0000 | MEDICATED_PATCH | TRANSDERMAL | 12 refills | Status: DC

## 2016-04-30 NOTE — Progress Notes (Signed)
70 y.o. G0P0 MarriedCaucasianF here for annual exam.  Doing well.  Denies vaginal bleeding.  Having some hot flashes.  Has experienced this over the last four nights.  She loved the patches.  Would like to see if she can find out the cost of these.    Patient's last menstrual period was 07/24/1991.          Sexually active: Yes.    The current method of family planning is post menopausal status.    Exercising: Yes.    biking, walking Smoker:  no  Health Maintenance: Pap:  12/06/14 Neg. 11/14/12 Neg. HR HPV:Neg  History of abnormal Pap:  no MMG:  01/20/16 BIRADS1:neg  Colonoscopy:  10/21/13 f/u 5 years  BMD:   12/21/13 Normal  TDaP:  11/2014 Pneumonia vaccine(s):  03/2011 Zostavax:   08/2006  Hep C testing: Unsure  Screening Labs: PCP, Urine today: Negative   reports that she has never smoked. She has never used smokeless tobacco. She reports that she does not drink alcohol or use drugs.  Past Medical History:  Diagnosis Date  . Endometriosis 3/96   bx neg, suspicious for endo  . Frozen shoulder 2010  . Infertility, female   . Menopause   . Ulcer [facility])     Past Surgical History:  Procedure Laterality Date  . APPENDECTOMY    . AUGMENTATION MAMMAPLASTY  1975  . CYST EXCISION Left 04/2015   ear   . CYST EXCISION Left 09/2015   Shoulder  . DILATION AND CURETTAGE OF UTERUS  1993  . EYE SURGERY  11/02/14  . OTHER SURGICAL HISTORY  3/07   excision fo cx granuloma tissue under anesthesia  . PELVIC LAPAROSCOPY  3/96   due to pelvic pain  . TONSILLECTOMY AND ADENOIDECTOMY      Family History  Problem Relation Age of Onset  . Dementia Mother   . Breast cancer Paternal Grandmother     ROS:  Pertinent items are noted in HPI.  Otherwise, a comprehensive ROS was negative.  Exam:   BP 120/76 (BP Location: Right Arm, Patient Position: Sitting, Cuff Size: Normal)   Pulse 88   Resp 16   Ht 5' 2.5" (1.588 m)   Wt 121 lb (54.9 kg)   LMP 07/24/1991   BMI 21.78 kg/m   Weight change: +2#   Height: 5' 2.5" (158.8 cm)  Ht Readings from Last 3 Encounters:  04/30/16 5' 2.5" (1.588 m)  12/06/14 5' 2.5" (1.588 m)  12/04/13 5' 2.25" (1.581 m)   General appearance: alert, cooperative and appears stated age Head: Normocephalic, without obvious abnormality, atraumatic Neck: no adenopathy, supple, symmetrical, trachea midline and thyroid normal to inspection and palpation Lungs: clear to auscultation bilaterally Breasts: normal appearance, no masses or tenderness, bilateral implants Heart: regular rate and rhythm Abdomen: soft, non-tender; bowel sounds normal; no masses,  no organomegaly Extremities: extremities normal, atraumatic, no cyanosis or edema Skin: Skin color, texture, turgor normal. No rashes or lesions Lymph nodes: Cervical, supraclavicular, and axillary nodes normal. No abnormal inguinal nodes palpated Neurologic: Grossly normal  Pelvic: External genitalia:  no lesions              Urethra:  normal appearing urethra with no masses, tenderness or lesions              Bartholins and Skenes: normal                 Vagina: normal appearing vagina with normal color and discharge, no lesions  Cervix: scarring of vaginal apex, tight--has been this way since I first met pt              Pap taken: No. Bimanual Exam:  Uterus:  normal size, contour, position, consistency, mobility, non-tender              Adnexa: normal adnexa and no mass, fullness, tenderness               Rectovaginal: Confirms               Anus:  normal sphincter tone. Tender hemorrhoid at 3 o'clock  Chaperone was present for exam.  A:  Well Woman with normal exam Grade 3 breast density with bilateral breast mplants On HRT Osteopenia Chronic off/on abdominal pain with distension, worse around umbilicus as of late Hemorrhoid  P: Mammogram yearly Pap smear 2016.  No pap today. Labs with PCP Ca-125 today Hep C antibody obtained Will try to see cost of combipatch for pt.  rx  for 50/250 to pharmacy.  Pt was cutting in 1/2 Anusol HC 2.5% every 4 hrs prn.  #30g/1RF return annually or prn

## 2016-05-01 LAB — HEPATITIS C ANTIBODY: HCV AB: NEGATIVE

## 2016-05-01 LAB — CA 125: CA 125: 17 U/mL (ref <35)

## 2016-05-10 ENCOUNTER — Other Ambulatory Visit: Payer: Medicare Other

## 2016-05-21 DIAGNOSIS — H35372 Puckering of macula, left eye: Secondary | ICD-10-CM | POA: Diagnosis not present

## 2016-05-21 DIAGNOSIS — H5213 Myopia, bilateral: Secondary | ICD-10-CM | POA: Diagnosis not present

## 2016-05-21 DIAGNOSIS — Z0100 Encounter for examination of eyes and vision without abnormal findings: Secondary | ICD-10-CM | POA: Diagnosis not present

## 2016-06-29 ENCOUNTER — Other Ambulatory Visit: Payer: Self-pay | Admitting: Obstetrics & Gynecology

## 2016-06-29 NOTE — Telephone Encounter (Signed)
Medication refill request: Medroxyprogesterone Last AEX:  04/30/16 SM Next AEX: 08/20/16 SM Last MMG (if hormonal medication request): 01/20/16 BIRADS1 Density B [facility] Refill authorized: 03/23/16 #90 0R. Please advise. Thank you.

## 2016-07-19 ENCOUNTER — Other Ambulatory Visit: Payer: Self-pay | Admitting: Gastroenterology

## 2016-07-19 DIAGNOSIS — R110 Nausea: Secondary | ICD-10-CM | POA: Diagnosis not present

## 2016-07-19 DIAGNOSIS — Z86010 Personal history of colonic polyps: Secondary | ICD-10-CM | POA: Diagnosis not present

## 2016-07-19 DIAGNOSIS — R1013 Epigastric pain: Secondary | ICD-10-CM | POA: Diagnosis not present

## 2016-07-19 DIAGNOSIS — R1011 Right upper quadrant pain: Secondary | ICD-10-CM | POA: Diagnosis not present

## 2016-08-06 ENCOUNTER — Ambulatory Visit (HOSPITAL_COMMUNITY): Payer: Medicare Other

## 2016-08-06 ENCOUNTER — Encounter (HOSPITAL_COMMUNITY): Payer: Medicare Other

## 2016-08-13 ENCOUNTER — Other Ambulatory Visit (HOSPITAL_COMMUNITY): Payer: Medicare Other

## 2016-08-13 ENCOUNTER — Ambulatory Visit (HOSPITAL_COMMUNITY): Payer: Medicare Other

## 2016-09-11 ENCOUNTER — Ambulatory Visit (HOSPITAL_COMMUNITY)
Admission: RE | Admit: 2016-09-11 | Discharge: 2016-09-11 | Disposition: A | Discharge status: Home / Self Care | Payer: Medicare Other | Source: Ambulatory Visit | Attending: Gastroenterology | Admitting: Gastroenterology

## 2016-09-11 ENCOUNTER — Encounter (HOSPITAL_COMMUNITY)
Admission: RE | Admit: 2016-09-11 | Discharge: 2016-09-11 | Disposition: A | Discharge status: Home / Self Care | Payer: Medicare Other | Source: Ambulatory Visit | Attending: Gastroenterology | Admitting: Gastroenterology

## 2016-09-11 DIAGNOSIS — R110 Nausea: Secondary | ICD-10-CM

## 2016-09-11 DIAGNOSIS — R109 Unspecified abdominal pain: Secondary | ICD-10-CM | POA: Diagnosis not present

## 2016-09-11 MED ORDER — TECHNETIUM TC 99M MEBROFENIN IV KIT: 5.1000 | PACK | Freq: Once | INTRAVENOUS | Status: DC | PRN

## 2016-09-26 ENCOUNTER — Other Ambulatory Visit: Payer: Self-pay | Admitting: Obstetrics & Gynecology

## 2016-09-26 NOTE — Telephone Encounter (Signed)
Medication refill request: Medroxyprogesterone Last AEX:  04/30/16 SM Next AEX: 08/20/17 SM Last MMG (if hormonal medication request): 01/17/16 BIRADS1, Density B, TBC Refill authorized: 06/29/16 #90 0R. Please advise. Thank you.   Medication refill request: Estradiol Last AEX:  04/30/16 SM Next AEX: 08/20/17 SM Last MMG (if hormonal medication request): 01/17/16 BIRADS1, Density B, TBC Refill authorized: 06/29/16 #90 0R. Please advise. Thank you.

## 2016-10-09 DIAGNOSIS — E785 Hyperlipidemia, unspecified: Secondary | ICD-10-CM | POA: Diagnosis not present

## 2016-10-09 DIAGNOSIS — H7091 Unspecified mastoiditis, right ear: Secondary | ICD-10-CM | POA: Diagnosis not present

## 2016-10-09 DIAGNOSIS — R911 Solitary pulmonary nodule: Secondary | ICD-10-CM | POA: Diagnosis not present

## 2016-11-08 DIAGNOSIS — E785 Hyperlipidemia, unspecified: Secondary | ICD-10-CM | POA: Diagnosis not present

## 2016-11-09 DIAGNOSIS — L821 Other seborrheic keratosis: Secondary | ICD-10-CM | POA: Diagnosis not present

## 2016-11-09 DIAGNOSIS — L718 Other rosacea: Secondary | ICD-10-CM | POA: Diagnosis not present

## 2016-11-09 DIAGNOSIS — L720 Epidermal cyst: Secondary | ICD-10-CM | POA: Diagnosis not present

## 2016-11-09 DIAGNOSIS — D225 Melanocytic nevi of trunk: Secondary | ICD-10-CM | POA: Diagnosis not present

## 2016-11-09 DIAGNOSIS — L820 Inflamed seborrheic keratosis: Secondary | ICD-10-CM | POA: Diagnosis not present

## 2016-11-14 ENCOUNTER — Other Ambulatory Visit: Payer: Self-pay | Admitting: Internal Medicine

## 2016-11-14 DIAGNOSIS — R42 Dizziness and giddiness: Secondary | ICD-10-CM

## 2016-11-14 DIAGNOSIS — M8580 Other specified disorders of bone density and structure, unspecified site: Secondary | ICD-10-CM

## 2016-11-20 ENCOUNTER — Ambulatory Visit
Admission: RE | Admit: 2016-11-20 | Discharge: 2016-11-20 | Disposition: A | Discharge status: Home / Self Care | Payer: Medicare Other | Source: Ambulatory Visit | Attending: Internal Medicine | Admitting: Internal Medicine

## 2016-11-20 DIAGNOSIS — R42 Dizziness and giddiness: Secondary | ICD-10-CM | POA: Diagnosis not present

## 2016-11-26 ENCOUNTER — Ambulatory Visit
Admission: RE | Admit: 2016-11-26 | Discharge: 2016-11-26 | Disposition: A | Discharge status: Home / Self Care | Payer: Medicare Other | Source: Ambulatory Visit | Attending: Internal Medicine | Admitting: Internal Medicine

## 2016-11-26 DIAGNOSIS — Z780 Asymptomatic menopausal state: Secondary | ICD-10-CM | POA: Diagnosis not present

## 2016-11-26 DIAGNOSIS — M85861 Other specified disorders of bone density and structure, right lower leg: Secondary | ICD-10-CM | POA: Diagnosis not present

## 2016-11-26 DIAGNOSIS — M8580 Other specified disorders of bone density and structure, unspecified site: Secondary | ICD-10-CM

## 2016-11-26 DIAGNOSIS — Z13820 Encounter for screening for osteoporosis: Secondary | ICD-10-CM | POA: Diagnosis not present

## 2016-12-07 ENCOUNTER — Other Ambulatory Visit: Payer: Self-pay | Admitting: Obstetrics & Gynecology

## 2016-12-07 DIAGNOSIS — Z1231 Encounter for screening mammogram for malignant neoplasm of breast: Secondary | ICD-10-CM

## 2017-01-22 ENCOUNTER — Ambulatory Visit: Payer: Medicare Other

## 2017-01-29 ENCOUNTER — Ambulatory Visit
Admission: RE | Admit: 2017-01-29 | Discharge: 2017-01-29 | Disposition: A | Discharge status: Home / Self Care | Payer: Medicare Other | Source: Ambulatory Visit | Attending: Obstetrics & Gynecology | Admitting: Obstetrics & Gynecology

## 2017-01-29 DIAGNOSIS — Z1231 Encounter for screening mammogram for malignant neoplasm of breast: Secondary | ICD-10-CM | POA: Diagnosis not present

## 2017-03-08 DIAGNOSIS — S9031XA Contusion of right foot, initial encounter: Secondary | ICD-10-CM | POA: Diagnosis not present

## 2017-03-21 DIAGNOSIS — S9031XA Contusion of right foot, initial encounter: Secondary | ICD-10-CM | POA: Diagnosis not present

## 2017-03-28 DIAGNOSIS — S9031XA Contusion of right foot, initial encounter: Secondary | ICD-10-CM | POA: Diagnosis not present

## 2017-03-28 DIAGNOSIS — M79671 Pain in right foot: Secondary | ICD-10-CM | POA: Diagnosis not present

## 2017-04-08 DIAGNOSIS — H6123 Impacted cerumen, bilateral: Secondary | ICD-10-CM | POA: Diagnosis not present

## 2017-04-08 DIAGNOSIS — H903 Sensorineural hearing loss, bilateral: Secondary | ICD-10-CM | POA: Diagnosis not present

## 2017-04-11 DIAGNOSIS — Z23 Encounter for immunization: Secondary | ICD-10-CM | POA: Diagnosis not present

## 2017-04-11 DIAGNOSIS — S92334D Nondisplaced fracture of third metatarsal bone, right foot, subsequent encounter for fracture with routine healing: Secondary | ICD-10-CM | POA: Diagnosis not present

## 2017-04-11 DIAGNOSIS — Z0000 Encounter for general adult medical examination without abnormal findings: Secondary | ICD-10-CM | POA: Diagnosis not present

## 2017-04-11 DIAGNOSIS — J3089 Other allergic rhinitis: Secondary | ICD-10-CM | POA: Diagnosis not present

## 2017-04-11 DIAGNOSIS — Z79899 Other long term (current) drug therapy: Secondary | ICD-10-CM | POA: Diagnosis not present

## 2017-04-11 DIAGNOSIS — E785 Hyperlipidemia, unspecified: Secondary | ICD-10-CM | POA: Diagnosis not present

## 2017-04-11 DIAGNOSIS — Z1389 Encounter for screening for other disorder: Secondary | ICD-10-CM | POA: Diagnosis not present

## 2017-04-11 DIAGNOSIS — M85859 Other specified disorders of bone density and structure, unspecified thigh: Secondary | ICD-10-CM | POA: Diagnosis not present

## 2017-04-18 DIAGNOSIS — M79671 Pain in right foot: Secondary | ICD-10-CM | POA: Diagnosis not present

## 2017-04-18 DIAGNOSIS — S9031XD Contusion of right foot, subsequent encounter: Secondary | ICD-10-CM | POA: Diagnosis not present

## 2017-05-02 DIAGNOSIS — S9031XD Contusion of right foot, subsequent encounter: Secondary | ICD-10-CM | POA: Diagnosis not present

## 2017-05-02 DIAGNOSIS — M79671 Pain in right foot: Secondary | ICD-10-CM | POA: Diagnosis not present

## 2017-05-09 DIAGNOSIS — M79671 Pain in right foot: Secondary | ICD-10-CM | POA: Diagnosis not present

## 2017-05-13 DIAGNOSIS — S92324G Nondisplaced fracture of second metatarsal bone, right foot, subsequent encounter for fracture with delayed healing: Secondary | ICD-10-CM | POA: Diagnosis not present

## 2017-05-13 DIAGNOSIS — S9031XD Contusion of right foot, subsequent encounter: Secondary | ICD-10-CM | POA: Diagnosis not present

## 2017-06-06 DIAGNOSIS — S92324K Nondisplaced fracture of second metatarsal bone, right foot, subsequent encounter for fracture with nonunion: Secondary | ICD-10-CM | POA: Diagnosis not present

## 2017-06-06 DIAGNOSIS — S9031XD Contusion of right foot, subsequent encounter: Secondary | ICD-10-CM | POA: Diagnosis not present

## 2017-06-18 ENCOUNTER — Ambulatory Visit (INDEPENDENT_AMBULATORY_CARE_PROVIDER_SITE_OTHER): Payer: Medicare Other | Admitting: Obstetrics & Gynecology

## 2017-06-18 ENCOUNTER — Encounter: Payer: Self-pay | Admitting: Obstetrics & Gynecology

## 2017-06-18 ENCOUNTER — Other Ambulatory Visit: Payer: Self-pay

## 2017-06-18 VITALS — BP 120/72 | HR 78 | Resp 14 | Wt 122.0 lb

## 2017-06-18 DIAGNOSIS — R1031 Right lower quadrant pain: Secondary | ICD-10-CM

## 2017-06-18 DIAGNOSIS — Z124 Encounter for screening for malignant neoplasm of cervix: Secondary | ICD-10-CM | POA: Diagnosis not present

## 2017-06-18 DIAGNOSIS — Z01419 Encounter for gynecological examination (general) (routine) without abnormal findings: Secondary | ICD-10-CM

## 2017-06-18 DIAGNOSIS — G8929 Other chronic pain: Secondary | ICD-10-CM

## 2017-06-18 NOTE — Progress Notes (Signed)
71 y.o. G0P0 MarriedCaucasianF here for annual exam.  Denies vaginal bleeding.  Has occasional RLQ pain that is sharp and fleeting.  Has been going on for years.  Has seen Dr. Collene Mares.  Had CT in 2012.  No changes in pain, just still present.  Denies urinary leakage.  Still on low dosed HRT.  Absolutely does not want to stop.  Has tried and felt miserable off it.  Willing to continue accepting risks.    PCP:  Dr. Jossie Ng at [facility].  Had appt in September.  Reports cholesterol was 130.    Patient's last menstrual period was 07/24/1991.          Sexually active: Yes.    The current method of family planning is post menopausal status.    Exercising: Yes.    biking Smoker:  no  Health Maintenance: Pap:  12/06/14 Neg   11/14/12 Neg. HR HPV:Neg  History of abnormal Pap:  no MMG:  01/29/17 BIRADS1:neg  Colonoscopy:  10/21/13 f/u 5 years  BMD:   11/26/16 Osteopenia  TDaP:  2016 Pneumonia vaccine(s): 2012 Zostavax: 2008 Hep C testing: 04/30/16 Neg  Screening Labs: CA 125    reports that  has never smoked. she has never used smokeless tobacco. She reports that she does not drink alcohol or use drugs.  Past Medical History:  Diagnosis Date  . Endometriosis 3/96   bx neg, suspicious for endo  . Frozen shoulder 2010  . Infertility, female   . Menopause   . Ulcer     Past Surgical History:  Procedure Laterality Date  . APPENDECTOMY    . AUGMENTATION MAMMAPLASTY  1975  . CYST EXCISION Left 04/2015   ear   . CYST EXCISION Left 09/2015   Shoulder  . DILATION AND CURETTAGE OF UTERUS  1993  . EYE SURGERY  11/02/14  . OTHER SURGICAL HISTORY  3/07   excision fo cx granuloma tissue under anesthesia  . PELVIC LAPAROSCOPY  3/96   due to pelvic pain  . TONSILLECTOMY AND ADENOIDECTOMY      Current Outpatient Medications  Medication Sig Dispense Refill  . aspirin 81 MG tablet Take 81 mg by mouth daily.    . cholecalciferol (VITAMIN D) 1000 UNITS tablet Take 1,000 Units by mouth daily.    .  Coenzyme Q10 (CO Q 10) 100 MG CAPS Take by mouth daily.    Marland Kitchen estradiol (ESTRACE) 0.5 MG tablet TAKE ONE TABLET BY MOUTH ONCE DAILY 90 tablet 4  . fluticasone (FLONASE) 50 MCG/ACT nasal spray daily.    . medroxyPROGESTERone (PROVERA) 2.5 MG tablet TAKE ONE TABLET BY MOUTH ONCE DAILY 90 tablet 4  . Multiple Vitamin (MULTIVITAMIN) tablet Take 1 tablet by mouth daily.    . rosuvastatin (CRESTOR) 5 MG tablet Take 1 tablet by mouth daily.     No current facility-administered medications for this visit.     Family History  Problem Relation Age of Onset  . Dementia Mother   . Breast cancer Paternal Grandmother     ROS:  Pertinent items are noted in HPI.  Otherwise, a comprehensive ROS was negative.  Exam:   BP 120/72 (BP Location: Right Arm, Patient Position: Sitting, Cuff Size: Normal)   Pulse 78   Resp 14   Wt 122 lb (55.3 kg)   LMP 07/24/1991   BMI 21.96 kg/m        Ht Readings from Last 3 Encounters:  04/30/16 5' 2.5" (1.588 m)  12/06/14 5' 2.5" (1.588  m)  12/04/13 5' 2.25" (1.581 m)    General appearance: alert, cooperative and appears stated age Head: Normocephalic, without obvious abnormality, atraumatic Neck: no adenopathy, supple, symmetrical, trachea midline and thyroid normal to inspection and palpation Lungs: clear to auscultation bilaterally Breasts: normal appearance, no masses or tenderness, bilateral breast implants Heart: regular rate and rhythm Abdomen: soft, non-tender; bowel sounds normal; no masses,  no organomegaly Extremities: extremities normal, atraumatic, no cyanosis or edema Skin: Skin color, texture, turgor normal. No rashes or lesions Lymph nodes: Cervical, supraclavicular, and axillary nodes normal. No abnormal inguinal nodes palpated Neurologic: Grossly normal   Pelvic: External genitalia:  no lesions              Urethra:  normal appearing urethra with no masses, tenderness or lesions              Bartholins and Skenes: normal                  Vagina: normal appearing vagina with normal color and discharge, no lesions              Cervix: no lesions              Pap taken: Yes.   Bimanual Exam:  Uterus:  normal size, contour, position, consistency, mobility, non-tender              Adnexa: normal adnexa and no mass, fullness, tenderness               Rectovaginal: Confirms               Anus:  normal sphincter tone, no lesions  Chaperone was present for exam.  A:  Well Woman with normal exam PMP, on HRT Grade 3 breast dnesity with bilateral breast implants Chronic RLQ pain, still persistent, has seen GI and had CT.  Not desirous of additional evaluation at this time.  P:   Mammogram guidelines reviewed.  Doing yearly.  pap smear obtained today On Estradiol 0.5mg  daily and Provera 2.5mg  daily.  Does not need RFs right now.  Will call when does or have pharmacy fax order to [place]. Ca 125 pending per pt desires.  Wants this yearly. return annually or prn

## 2017-06-19 LAB — CA 125: Cancer Antigen (CA) 125: 23.9 U/mL (ref 0.0–38.1)

## 2017-06-21 ENCOUNTER — Other Ambulatory Visit (HOSPITAL_COMMUNITY)
Admission: RE | Admit: 2017-06-21 | Discharge: 2017-06-21 | Disposition: A | Discharge status: Home / Self Care | Payer: Medicare Other | Source: Ambulatory Visit | Attending: Obstetrics & Gynecology | Admitting: Obstetrics & Gynecology

## 2017-06-21 DIAGNOSIS — Z124 Encounter for screening for malignant neoplasm of cervix: Secondary | ICD-10-CM | POA: Diagnosis not present

## 2017-06-21 DIAGNOSIS — R1031 Right lower quadrant pain: Secondary | ICD-10-CM

## 2017-06-21 DIAGNOSIS — G8929 Other chronic pain: Secondary | ICD-10-CM | POA: Insufficient documentation

## 2017-06-24 LAB — CYTOLOGY - PAP: DIAGNOSIS: NEGATIVE

## 2017-06-27 DIAGNOSIS — S92324K Nondisplaced fracture of second metatarsal bone, right foot, subsequent encounter for fracture with nonunion: Secondary | ICD-10-CM | POA: Diagnosis not present

## 2017-07-18 DIAGNOSIS — D485 Neoplasm of uncertain behavior of skin: Secondary | ICD-10-CM | POA: Diagnosis not present

## 2017-07-18 DIAGNOSIS — L820 Inflamed seborrheic keratosis: Secondary | ICD-10-CM | POA: Diagnosis not present

## 2017-07-18 DIAGNOSIS — L858 Other specified epidermal thickening: Secondary | ICD-10-CM | POA: Diagnosis not present

## 2017-07-19 DIAGNOSIS — H5213 Myopia, bilateral: Secondary | ICD-10-CM | POA: Diagnosis not present

## 2017-07-19 DIAGNOSIS — H35372 Puckering of macula, left eye: Secondary | ICD-10-CM | POA: Diagnosis not present

## 2017-07-19 DIAGNOSIS — H2513 Age-related nuclear cataract, bilateral: Secondary | ICD-10-CM | POA: Diagnosis not present

## 2017-07-22 DIAGNOSIS — S92324K Nondisplaced fracture of second metatarsal bone, right foot, subsequent encounter for fracture with nonunion: Secondary | ICD-10-CM | POA: Diagnosis not present

## 2017-07-30 ENCOUNTER — Telehealth: Payer: Self-pay | Admitting: *Deleted

## 2017-07-30 ENCOUNTER — Ambulatory Visit (INDEPENDENT_AMBULATORY_CARE_PROVIDER_SITE_OTHER): Payer: Medicare Other | Admitting: Podiatry

## 2017-07-30 ENCOUNTER — Other Ambulatory Visit: Payer: Self-pay | Admitting: Podiatry

## 2017-07-30 ENCOUNTER — Ambulatory Visit (INDEPENDENT_AMBULATORY_CARE_PROVIDER_SITE_OTHER): Payer: Medicare Other

## 2017-07-30 DIAGNOSIS — S92301G Fracture of unspecified metatarsal bone(s), right foot, subsequent encounter for fracture with delayed healing: Secondary | ICD-10-CM

## 2017-07-30 DIAGNOSIS — M778 Other enthesopathies, not elsewhere classified: Secondary | ICD-10-CM

## 2017-07-30 DIAGNOSIS — S9781XA Crushing injury of right foot, initial encounter: Secondary | ICD-10-CM | POA: Diagnosis not present

## 2017-07-30 DIAGNOSIS — M779 Enthesopathy, unspecified: Principal | ICD-10-CM

## 2017-07-30 DIAGNOSIS — M7751 Other enthesopathy of right foot: Secondary | ICD-10-CM | POA: Diagnosis not present

## 2017-07-30 MED ORDER — DICLOFENAC EPOLAMINE 1.3 % TD PTCH: 1.0000 | MEDICATED_PATCH | Freq: Two times a day (BID) | TRANSDERMAL | 0 refills | Status: DC

## 2017-07-30 NOTE — Telephone Encounter (Signed)
I spoke with pt she states [facility] and I could get her in to be seen. Pt states she has had a broken top of her foot since August of 2018. Pt states she has x-rays and MRIs I told pt to bring those to the 08/09/2017 appt. I told pt I would need to check for opening in the appt schedule and to continue a stiff bottom shoe for comfort and to decrease movement of the area, rest, ice and elevation for periods of discomfort and swelling.

## 2017-07-30 NOTE — Telephone Encounter (Signed)
Pt states she was told to call me by Harrold Donath.

## 2017-07-30 NOTE — Telephone Encounter (Signed)
I spoke with Dr. Jacqualyn Posey and he states have pt come in today or tomorrow 3:15pm. I informed pt of today's 2:45pm appt and she states she will be her.

## 2017-07-31 ENCOUNTER — Telehealth: Payer: Self-pay | Admitting: Podiatry

## 2017-07-31 NOTE — Progress Notes (Signed)
Subjective:   Patient ID: Alexandra Cain, female   DOB: 72 y.o.   MRN: [id number]   HPI Alexandra Cain presents the office today for concerns of ongoing right foot pain which started in March 06, 2017 after trash can fell on top of her foot.  She has been under the care of [facility] orthopedics for this since then and she has been in the boot.  She recently had x-rays and she states that the found a fracture at the second metatarsal however it was not healing appropriately so she had an MRI done in October 2018 which did reveal a fracture.  She recently followed up last week and she was told it was not healing.  She states that she is getting discouraged and like to have another opinion.  She has also been using the bone stimulator daily.  No other recent injury or trauma.  She has noticed some swelling and redness to the top of her foot at times.  No other concerns today.   Review of Systems  All other systems reviewed and are negative.  Past Medical History:  Diagnosis Date  . Endometriosis 3/96   bx neg, suspicious for endo  . Frozen shoulder 2010  . Infertility, female   . Menopause   . Ulcer     Past Surgical History:  Procedure Laterality Date  . APPENDECTOMY    . AUGMENTATION MAMMAPLASTY  1975  . CYST EXCISION Left 04/2015   ear   . CYST EXCISION Left 09/2015   Shoulder  . DILATION AND CURETTAGE OF UTERUS  1993  . EYE SURGERY  11/02/14  . OTHER SURGICAL HISTORY  3/07   excision fo cx granuloma tissue under anesthesia  . PELVIC LAPAROSCOPY  3/96   due to pelvic pain  . TONSILLECTOMY AND ADENOIDECTOMY       Current Outpatient Medications:  .  aspirin 81 MG tablet, Take 81 mg by mouth daily., Disp: , Rfl:  .  cholecalciferol (VITAMIN D) 1000 UNITS tablet, Take 1,000 Units by mouth daily., Disp: , Rfl:  .  Coenzyme Q10 (CO Q 10) 100 MG CAPS, Take by mouth daily., Disp: , Rfl:  .  diclofenac (FLECTOR) 1.3 % PTCH, Place 1 patch onto the skin 2 (two) times daily., Disp: 30 patch, Rfl:  0 .  estradiol (ESTRACE) 0.5 MG tablet, TAKE ONE TABLET BY MOUTH ONCE DAILY, Disp: 90 tablet, Rfl: 4 .  fluticasone (FLONASE) 50 MCG/ACT nasal spray, daily., Disp: , Rfl:  .  medroxyPROGESTERone (PROVERA) 2.5 MG tablet, TAKE ONE TABLET BY MOUTH ONCE DAILY, Disp: 90 tablet, Rfl: 4 .  Multiple Vitamin (MULTIVITAMIN) tablet, Take 1 tablet by mouth daily., Disp: , Rfl:  .  rosuvastatin (CRESTOR) 5 MG tablet, Take 1 tablet by mouth daily., Disp: , Rfl:   No Known Allergies  Social History   Socioeconomic History  . Marital status: Married    Spouse name: Not on file  . Number of children: Not on file  . Years of education: Not on file  . Highest education level: Not on file  Social Needs  . Financial resource strain: Not on file  . Food insecurity - worry: Not on file  . Food insecurity - inability: Not on file  . Transportation needs - medical: Not on file  . Transportation needs - non-medical: Not on file  Occupational History  . Not on file  Tobacco Use  . Smoking status: Never Smoker  . Smokeless tobacco: Never Used  Substance and  Sexual Activity  . Alcohol use: No  . Drug use: No  . Sexual activity: Yes    Partners: Male    Birth control/protection: Post-menopausal  Other Topics Concern  . Not on file  Social History Narrative  . Not on file        Objective:  Physical Exam  General: AAO x3, NAD  Dermatological: Skin is warm, dry and supple bilateral. Nails x 10 are well manicured; remaining integument appears unremarkable at this time. There are no open sores, no preulcerative lesions, no rash or signs of infection present.  Vascular: Dorsalis Pedis artery and Posterior Tibial artery pedal pulses are 2/4 bilateral with immedate capillary fill time.  No varicosities and no lower extremity edema present bilateral. There is no pain with calf compression, swelling, warmth, erythema.   Neruologic: Grossly intact via light touch bilateral. Protective threshold with Semmes  Wienstein monofilament intact to all pedal sites bilateral.   Musculoskeletal: There is tenderness palpation of the second metatarsal base on the right foot and there is mild tenderness to vibratory sensation.  There is no other area of pinpoint tenderness identified.  There is mild edema to the dorsal aspect of right foot there is mild erythema compared to the other areas of her leg into the contralateral extremity.  There is no increase in warmth or excessive drying or splitting of the skin or any other discoloration.  Muscular strength 5/5 in all groups tested bilateral.  Gait: Unassisted, Nonantalgic.       Assessment:   Chronic fracture right second metatarsal base     Plan:  -Treatment options discussed including all alternatives, risks, and complications -Etiology of symptoms were discussed -X-rays were obtained and reviewed with the patient.  I was unable to appreciate any definitive evidence of acute fracture today however there was a radiolucent line that may represent an old fracture of the base of the second metatarsal. -I reviewed her MRI as well as the last x-rays as well.  I am going to request records from [place].  I also recommended a new MRI of the right foot to evaluate the healing of the fracture. -We discussed treatment options at this point.  For now we will continue the cam boot I ordered Flector patches that she can apply topically.  Pending the MRI we discussed surgical intervention versus physical therapy and other treatment options.  I want her to continue the bone similar.  Also concern for CRPS but I still think that she has a reason to have pain and symptoms given the fracture. -Follow-up after MRI or sooner if any issues are to arise.  Call any questions or concerns in the meantime.  She agrees this plan has no further questions.  Trula Slade DPM

## 2017-07-31 NOTE — Telephone Encounter (Signed)
Called pt to let her know that Dr. Jacqualyn Posey is requesting her records from [facility]. I asked her if she had signed a release form with them to which she replied she has not. She asked if I could e-mail her the form so she doesn't have to come back to the office. She gave me the e-mail address of [email or web address]. I told her I would get the form e-mailed to her shortly and once I got the signed form back I would fax it to [facility] to get her records from their office.

## 2017-08-01 ENCOUNTER — Telehealth: Payer: Self-pay | Admitting: *Deleted

## 2017-08-01 DIAGNOSIS — S92901G Unspecified fracture of right foot, subsequent encounter for fracture with delayed healing: Secondary | ICD-10-CM

## 2017-08-01 NOTE — Telephone Encounter (Signed)
-----   Message from Trula Slade, DPM sent at 07/31/2017  5:50 PM EST ----- Can you please order an MRI of the right foot to evaluate second metatarsal base fracture?  She previously had an [facility] in October she did reveal a fracture but continued she continues to have pain despite immobilization and will assess the healing.  Unable to identify on x-ray.

## 2017-08-01 NOTE — Telephone Encounter (Signed)
Pt states Dr. Jacqualyn Posey was to order a MRI but no one has called.

## 2017-08-01 NOTE — Telephone Encounter (Signed)
Orders faxed to [facility]. 

## 2017-08-01 NOTE — Telephone Encounter (Signed)
Left message informing pt that [place] had a few schedulers out sick and if she would call [phone] to schedule for her convenience.

## 2017-08-05 ENCOUNTER — Ambulatory Visit
Admission: RE | Admit: 2017-08-05 | Discharge: 2017-08-05 | Disposition: A | Discharge status: Home / Self Care | Payer: Medicare Other | Source: Ambulatory Visit | Attending: Podiatry | Admitting: Podiatry

## 2017-08-05 DIAGNOSIS — S92901G Unspecified fracture of right foot, subsequent encounter for fracture with delayed healing: Secondary | ICD-10-CM

## 2017-08-05 DIAGNOSIS — S62641A Nondisplaced fracture of proximal phalanx of left index finger, initial encounter for closed fracture: Secondary | ICD-10-CM | POA: Diagnosis not present

## 2017-08-07 ENCOUNTER — Telehealth: Payer: Self-pay | Admitting: Podiatry

## 2017-08-07 NOTE — Telephone Encounter (Signed)
I had an MRI done on Monday and I was wondering if Dr. Jacqualyn Posey had gotten the results and I could make an appointment to see him again. If you would please call me back at [phone]. Thank you.

## 2017-08-07 NOTE — Telephone Encounter (Signed)
I informed pt the MRI results were in and to make an appt to discuss with Dr. Jacqualyn Posey. Transferred pt to schedulers.

## 2017-08-08 ENCOUNTER — Ambulatory Visit: Payer: Medicare Other | Admitting: Podiatry

## 2017-08-08 ENCOUNTER — Telehealth: Payer: Self-pay | Admitting: Podiatry

## 2017-08-08 DIAGNOSIS — S92324K Nondisplaced fracture of second metatarsal bone, right foot, subsequent encounter for fracture with nonunion: Secondary | ICD-10-CM | POA: Diagnosis not present

## 2017-08-08 DIAGNOSIS — M899 Disorder of bone, unspecified: Secondary | ICD-10-CM | POA: Diagnosis not present

## 2017-08-08 DIAGNOSIS — E039 Hypothyroidism, unspecified: Secondary | ICD-10-CM | POA: Diagnosis not present

## 2017-08-08 NOTE — Telephone Encounter (Signed)
I'm a pt of Dr. Leigh Aurora and he wanted me to get lab work done today. I did and I went to [facility] but the results won't be sent to Dr. Jacqualyn Posey but to Dr. Doran Durand since Dr. Doran Durand is who requested the blood work. She said it would be available in the next 48 hours so if you could call Monday and get those results from Dr. Doran Durand I would greatly appreciate it. If you need to speak to me, my number is [phone].

## 2017-08-08 NOTE — Telephone Encounter (Signed)
I informed pt that she should inform Dr. Nona Dell office she would like a copy of the labs faxed to [phone] Dr. Jacqualyn Posey. Pt states understanding.

## 2017-08-08 NOTE — Telephone Encounter (Signed)
Called the medical records department at [facility] and asked the status of the medical records request I faxed on the pt back on 31 July 2017. Stated they never received the request and I asked if the fax number was [phone] and was told to fax the request to [phone] instead.

## 2017-08-09 ENCOUNTER — Ambulatory Visit: Payer: Medicare Other | Admitting: Podiatry

## 2017-08-09 NOTE — Telephone Encounter (Signed)
I called the patient discussed the MRI results on Wednesday.  She states that her symptoms are unchanged.  At this point I recommend her to wear the cam boot all the times and nonweightbearing as much as possible.  She also states that her vitamin D test was approved by her insurance but she did not get this done.  I encouraged her to get this done and she already has an order for [facility] for this.  I want her to continue with the use of the bone stimulator.  She wears the cam boot while walking but otherwise she has not been wearing it and I think that we were not start with try to wear this more consistently.  We discussed other treatment options including cast immobilization but she states that she will have difficulty being completely nonweightbearing at all times.  Discussed this seems to continue possible surgical intervention.  We will get the vitamin D level checked and continue mobilization, bone stimulator.  She did not get the anti-inflammatory cream every hold off on that as well.  I will see her back next couple weeks to see how she is doing or sooner if needed.

## 2017-08-15 ENCOUNTER — Telehealth: Payer: Self-pay | Admitting: Podiatry

## 2017-08-15 NOTE — Telephone Encounter (Signed)
I spoke with [place] Coordinator and she states she has not received labs or records for pt. I spoke with pt and directed her to contact [facility] for the labs and records, possibly needing to sign release form.

## 2017-08-15 NOTE — Telephone Encounter (Signed)
I'm a pt of Dr. Leigh Aurora and I had blood work done last week at [facility]. I went by their office on Tuesday and filled out a form asking them to send the results to you all. I'm just calling to see if they had done that. If they have done that do I need to make an appointment with Dr. Jacqualyn Posey? If they have not sent the results, what would my next step be? My phone number is [phone]. Thank you.

## 2017-08-15 NOTE — Telephone Encounter (Signed)
I left voicemail for pt to call to schedule appt time for 1/25 with Dr. Jacqualyn Posey.

## 2017-08-15 NOTE — Telephone Encounter (Signed)
Called and left a message on the general mailbox voicemail at [facility] (form[facility]) checking on the status of a records request I sent to them to have pt's medical records faxed to our office. I faxed them a request on 09 January and 08 August 2017. Asked them to call me back at [phone].

## 2017-08-16 ENCOUNTER — Encounter: Payer: Self-pay | Admitting: Podiatry

## 2017-08-16 ENCOUNTER — Ambulatory Visit (INDEPENDENT_AMBULATORY_CARE_PROVIDER_SITE_OTHER): Payer: Medicare Other | Admitting: Podiatry

## 2017-08-16 DIAGNOSIS — S92301G Fracture of unspecified metatarsal bone(s), right foot, subsequent encounter for fracture with delayed healing: Secondary | ICD-10-CM

## 2017-08-19 ENCOUNTER — Telehealth: Payer: Self-pay | Admitting: *Deleted

## 2017-08-19 NOTE — Telephone Encounter (Signed)
"  I have surgery scheduled next Wednesday, February 6.  I was wondering what time that will be.  Should I discontinue the aspirin I take every night?  He said something about an antibiotics.  I need to know where to get them or when to start taking them.  I need to know exactly where the surgery will take place.  If someone could, call me back."

## 2017-08-20 ENCOUNTER — Ambulatory Visit: Payer: Medicare Other | Admitting: Obstetrics & Gynecology

## 2017-08-20 NOTE — Progress Notes (Signed)
Subjective: Alexandra Cain presents the office with a friend for follow-up evaluation of injury to the right second metatarsal.  She had an MRI performed she also had some blood work outside last saw her she presents today for follow-up evaluation to discuss treatment options.  She again states today that she is very frustrated with her foot and she is continued to have pain over the last 5 months.  She has been under the care of orthopedics.  She has been in the cam boot walking otherwise she does not with the boot.  Since I last saw her she has been nonweightbearing in the cam boot.  She is also been using the bone sooner if she is using for a total of 61 treatment she states.  She has not noticed any improvement since starting the bone stimulator.  She does state that she does this daily.  No recent injury or trauma.  She has had ongoing swelling of the foot as well.  She has no other concerns today no recent injury since I last saw her. Denies any systemic complaints such as fevers, chills, nausea, vomiting. No acute changes since last appointment, and no other complaints at this time.   Objective: AAO x3, NAD DP/PT pulses palpable bilaterally, CRT less than 3 seconds There is continuation of tenderness directly along the second metatarsal base.  There is localized edema to this area.  There is no other area of pinpoint tenderness there is no pain with vibratory sensation otherwise.  Ankle, subtalar joint range of motion intact.  MPJ range of motion intact. No open lesions or pre-ulcerative lesions.  No pain with calf compression, swelling, warmth, erythema  Assessment: Delayed healing right second metatarsal fracture  Plan: -All treatment options discussed with the patient including all alternatives, risks, complications.  -I again reviewed the MRI with the patient.  Also she had blood work done from [place] that we reviewed. -She is very frustrated with her foot but she continues to have  pain.  We discussed treatment options at this point.  We discussed further conservative treatment as well as surgical.  Conservative treatment we discussed was cast immobilization and could also attempt EPAT.  She states that she would like to proceed with surgery most likely postoperative course.  We discussed alternatives, risks, complications of the surgery.  She presents today with a friend as she is very upset about her foot.  Her friend will try to give her encouragement.  We will tentatively schedule her for surgery but I will see her back the day before the surgery to further discuss.  I also gave her literature about EPAT.  She will consider her options.  For now continue cam boot, ice elevation and compression anklet. -Patient encouraged to call the office with any questions, concerns, change in symptoms.   Trula Slade DPM

## 2017-08-22 ENCOUNTER — Telehealth: Payer: Self-pay | Admitting: Podiatry

## 2017-08-22 ENCOUNTER — Telehealth: Payer: Self-pay | Admitting: *Deleted

## 2017-08-22 NOTE — Telephone Encounter (Addendum)
I told pt that she could discuss the aspirin and antibiotic question with Dr. Jacqualyn Posey, but in some cases our doctors allow pt to continue the aspirin the pt may have just a little more bleeding than a pt not on the aspirin and if an antibiotic was given it would be written the day of surgery. Pt states she is to be non-weight bearing, I offered a knee scooter from [place]. Pt states she just turned one of those in. Pt states she is scared after talking to DR. Wagoner about blood clots and numbness after surgery. I told pt that we have to tell pts all risk, but Dr. Jacqualyn Posey is a excellent surgeon and has had good results.

## 2017-08-22 NOTE — Telephone Encounter (Signed)
I'm supposed to have surgery with Dr. Jacqualyn Posey next Wednesday 06 February. I had some questions about discontinuing Asprin, do I need to take an antibiotic? If I need to take an antibiotic when do I get it to start it? If someone could call me back please. My number is [phone]. Thank you.

## 2017-08-22 NOTE — Telephone Encounter (Signed)
Called and left a message for the patient and there was no answer and per Dr Jacqualyn Posey the patient is to come in on 08-27-17 and go over the surgery that is going to be scheduled for 08-28-17. Alexandra Cain

## 2017-08-27 ENCOUNTER — Other Ambulatory Visit: Payer: Self-pay | Admitting: *Deleted

## 2017-08-27 ENCOUNTER — Encounter: Payer: Self-pay | Admitting: Podiatry

## 2017-08-27 ENCOUNTER — Ambulatory Visit (INDEPENDENT_AMBULATORY_CARE_PROVIDER_SITE_OTHER): Payer: Medicare Other | Admitting: Podiatry

## 2017-08-27 DIAGNOSIS — S92301G Fracture of unspecified metatarsal bone(s), right foot, subsequent encounter for fracture with delayed healing: Secondary | ICD-10-CM

## 2017-08-27 MED ORDER — OXYCODONE-ACETAMINOPHEN 5-325 MG PO TABS
1.0000 | ORAL_TABLET | Freq: Four times a day (QID) | ORAL | 0 refills | Status: DC | PRN
Start: 2017-08-27 — End: 2017-09-26

## 2017-08-27 MED ORDER — CEPHALEXIN 500 MG PO CAPS: 500.0000 mg | ORAL_CAPSULE | Freq: Three times a day (TID) | ORAL | 0 refills | Status: DC

## 2017-08-27 MED ORDER — PROMETHAZINE HCL 25 MG PO TABS: 25.0000 mg | ORAL_TABLET | Freq: Three times a day (TID) | ORAL | 0 refills | Status: DC | PRN

## 2017-08-27 NOTE — Patient Instructions (Signed)

## 2017-08-27 NOTE — Telephone Encounter (Signed)
Alexandra Cain spoke to the patient and she came in for an appointment today.

## 2017-08-28 ENCOUNTER — Encounter: Payer: Self-pay | Admitting: Podiatry

## 2017-08-28 ENCOUNTER — Telehealth: Payer: Self-pay | Admitting: Podiatry

## 2017-08-28 DIAGNOSIS — E7800 Pure hypercholesterolemia, unspecified: Secondary | ICD-10-CM | POA: Diagnosis not present

## 2017-08-28 DIAGNOSIS — X58XXXA Exposure to other specified factors, initial encounter: Secondary | ICD-10-CM | POA: Diagnosis not present

## 2017-08-28 DIAGNOSIS — S92301B Fracture of unspecified metatarsal bone(s), right foot, initial encounter for open fracture: Secondary | ICD-10-CM | POA: Diagnosis not present

## 2017-08-28 DIAGNOSIS — Y939 Activity, unspecified: Secondary | ICD-10-CM | POA: Diagnosis not present

## 2017-08-28 DIAGNOSIS — M25571 Pain in right ankle and joints of right foot: Secondary | ICD-10-CM | POA: Diagnosis not present

## 2017-08-28 DIAGNOSIS — Y999 Unspecified external cause status: Secondary | ICD-10-CM | POA: Diagnosis not present

## 2017-08-28 DIAGNOSIS — Y929 Unspecified place or not applicable: Secondary | ICD-10-CM | POA: Diagnosis not present

## 2017-08-28 DIAGNOSIS — S92301G Fracture of unspecified metatarsal bone(s), right foot, subsequent encounter for fracture with delayed healing: Secondary | ICD-10-CM

## 2017-08-28 DIAGNOSIS — S92324G Nondisplaced fracture of second metatarsal bone, right foot, subsequent encounter for fracture with delayed healing: Secondary | ICD-10-CM | POA: Diagnosis not present

## 2017-08-28 NOTE — Progress Notes (Signed)
Subjective: Alexandra Cain presents the office with a friend for follow-up evaluation of injury to the right second metatarsal.  She has been immobilized over the last 5 months or sooner in the bone stimulator over 60 times and she continues to have pain.  Relates MRI did show delayed healing of her fracture.  At her last appointment we discussed surgical intervention at this point she wishes to go ahead and proceed with surgery given the continued pain to the right foot.  She is scheduled for surgery tomorrow. Denies any systemic complaints such as fevers, chills, nausea, vomiting. No acute changes since last appointment, and no other complaints at this time.   Objective: AAO x3, NAD DP/PT pulses palpable bilaterally, CRT less than 3 seconds There is continuation of tenderness directly along the second metatarsal base.  There is localized edema to this area.  There is no other area of pinpoint tenderness there is no pain with vibratory sensation otherwise.  Ankle, subtalar joint range of motion intact.  MPJ range of motion intact.  Overall her exam is been unchanged since I have met her. No open lesions or pre-ulcerative lesions.  No pain with calf compression, swelling, warmth, erythema  Assessment: Delayed healing right second metatarsal fracture  Plan: -All treatment options discussed with the patient including all alternatives, risks, complications.  -Again reviewed the MRI with the patient as well as x-rays.  We discussed both conservative as well as surgical treatment options.  At this point she wishes to proceed with surgical intervention. -We discussed second metatarsal open reduction internal fixation right foot. -The incision placement as well as the postoperative course was discussed with the patient. I discussed risks of the surgery which include, but not limited to, infection, bleeding, pain, swelling, need for further surgery, delayed or nonhealing, painful or ugly scar, numbness or sensation  changes, over/under correction, recurrence, transfer lesions, further deformity, hardware failure, DVT/PE, loss of toe/foot. Patient understands these risks and wishes to proceed with surgery. The surgical consent was reviewed with the patient all 3 pages were signed. No promises or guarantees were given to the outcome of the procedure. All questions were answered to the best of my ability. Before the surgery the patient was encouraged to call the office if there is any further questions. The surgery will be performed at the [facility] on an outpatient basis. -Postoperative medications were given today. -She will be nonweightbearing postoperatively.  Discussed possible cast.  Trula Slade DPM

## 2017-08-28 NOTE — Telephone Encounter (Signed)
I spoke with Dr. March Rummage, he states pt can have 50mg  Phenergan every 8 hours. I told pt's husband, Herbie Baltimore and he states understanding. I told Herbie Baltimore to give the phenergan about 30 minutes prior to the pain medication and not to take the pain medication with the antibiotic, take both with a little toast or crackers, and to use a cold cloth to the back of the neck to decrease nausea also. Herbie Baltimore states understanding.

## 2017-08-28 NOTE — Telephone Encounter (Signed)
I'm calling for Alexandra Cain who had surgery this morning with Dr. Jacqualyn Posey. She is throwing up. On the medication prescribed for nausea it says to take once every eight hours but we was wondering if she could take it more often than that. If you could call us back and let us know. The phone number is [phone]. Thank you.

## 2017-08-28 NOTE — Progress Notes (Signed)
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 The surgical consent was reviewed with the patient and we discussed the procedure as well as the postoperative course. I again discussed all alternatives, risks, complications. I answered all of their questions to the best of my ability and they wish to proceed with surgery. No promises or guarantees were given as to the outcome of the surgery.   The surgical consent was signed.   Patient is NPO since midnight.  The patient does not have have a history of blood clots or bleeding disorders. She has not taken her Aspirin in 2-3 days.   She already picked up her postop medications.   No further questions.   Celesta Gentile, [place]

## 2017-08-29 ENCOUNTER — Telehealth: Payer: Self-pay | Admitting: *Deleted

## 2017-08-29 ENCOUNTER — Ambulatory Visit: Payer: Medicare Other | Admitting: Podiatry

## 2017-08-29 NOTE — Telephone Encounter (Signed)
Called and spoke with patient and patient stated that she was getting there and no fever, chills, nausea today but was a little sick yesterday and threw up and the pain scale is a 6 or 7 now and did take a pain pill about 10 minutes ok and is taken one oxy every 6 hours and is elevating and icing 4 times a day and I stated that if any concerns or questions to call the office and we would see her Monday for her appointment. Alexandra Cain

## 2017-09-02 ENCOUNTER — Ambulatory Visit (INDEPENDENT_AMBULATORY_CARE_PROVIDER_SITE_OTHER): Payer: Medicare Other | Admitting: Podiatry

## 2017-09-02 ENCOUNTER — Ambulatory Visit (INDEPENDENT_AMBULATORY_CARE_PROVIDER_SITE_OTHER): Payer: Medicare Other

## 2017-09-02 ENCOUNTER — Encounter: Payer: Self-pay | Admitting: Podiatry

## 2017-09-02 DIAGNOSIS — S92301G Fracture of unspecified metatarsal bone(s), right foot, subsequent encounter for fracture with delayed healing: Secondary | ICD-10-CM

## 2017-09-02 DIAGNOSIS — Z9889 Other specified postprocedural states: Secondary | ICD-10-CM

## 2017-09-02 MED ORDER — PROMETHAZINE HCL 25 MG PO TABS: 25.0000 mg | ORAL_TABLET | Freq: Three times a day (TID) | ORAL | 0 refills | Status: DC | PRN

## 2017-09-03 ENCOUNTER — Other Ambulatory Visit: Payer: Self-pay | Admitting: Podiatry

## 2017-09-03 ENCOUNTER — Telehealth: Payer: Self-pay | Admitting: Podiatry

## 2017-09-03 MED ORDER — MEPERIDINE HCL 50 MG PO TABS: 50.0000 mg | ORAL_TABLET | ORAL | 0 refills | Status: DC | PRN

## 2017-09-03 NOTE — Progress Notes (Signed)
Sent demerol to her pharmacy

## 2017-09-03 NOTE — Telephone Encounter (Signed)
I informed Alexandra Cain of Dr. Leigh Aurora Demerol orders, and to continue rest, ice and elevation and could have the foot even level with hip.

## 2017-09-03 NOTE — Telephone Encounter (Signed)
Lets do demerol 50mg  q4 hour prn pain disp #20

## 2017-09-03 NOTE — Telephone Encounter (Signed)
I saw Dr. Jacqualyn Posey for my first post-op appointment yesterday. After my foot was un-wrapped then re-wrapped it has been hurting a lot worse. It feels like I just had surgery. I only have 5 pain pills left and wanted to know if he could refill them or a different kind that doesn't cause the nausea like he mentioned. He did send the phenergan in yesterday. Please call me back at [phone]. Thank you.

## 2017-09-05 ENCOUNTER — Telehealth: Payer: Self-pay | Admitting: *Deleted

## 2017-09-05 NOTE — Telephone Encounter (Signed)
Pt states her foot feels like the dressing is too tight. I told pt to remove the brownish ace wrap, elevate the foot for 15 minutes and if the pain worsened during the 15 minutes then dangle it for 15 minutes, that being the only time it was okay to dangle the foot, after 15 minutes place foot at hip level and loosely rewrap the ace starting at the toes moving up the foot and replace the sock and boot. Pt states understanding.

## 2017-09-06 ENCOUNTER — Telehealth: Payer: Self-pay | Admitting: *Deleted

## 2017-09-06 MED ORDER — CYCLOBENZAPRINE HCL 10 MG PO TABS: ORAL_TABLET | ORAL | 0 refills | Status: DC

## 2017-09-06 NOTE — Telephone Encounter (Signed)
Dr. Paulla Dolly ordered flexeril 10mg  #15 one every 8-12 hours prn muscle spasms, and said he did not think it was related to the demerol.

## 2017-09-06 NOTE — Telephone Encounter (Signed)
Pt's husband states pt has stopped the Demerol, now having muscle spasms all over her body.

## 2017-09-09 ENCOUNTER — Ambulatory Visit (INDEPENDENT_AMBULATORY_CARE_PROVIDER_SITE_OTHER): Payer: Medicare Other | Admitting: Podiatry

## 2017-09-09 ENCOUNTER — Encounter: Payer: Self-pay | Admitting: Podiatry

## 2017-09-09 VITALS — Temp 96.1°F

## 2017-09-09 DIAGNOSIS — S92301G Fracture of unspecified metatarsal bone(s), right foot, subsequent encounter for fracture with delayed healing: Secondary | ICD-10-CM

## 2017-09-09 DIAGNOSIS — Z9889 Other specified postprocedural states: Secondary | ICD-10-CM

## 2017-09-09 MED ORDER — HYDROCODONE-ACETAMINOPHEN 5-325 MG PO TABS: 1.0000 | ORAL_TABLET | Freq: Four times a day (QID) | ORAL | 0 refills | Status: DC | PRN

## 2017-09-09 MED ORDER — CEPHALEXIN 500 MG PO CAPS: 500.0000 mg | ORAL_CAPSULE | Freq: Three times a day (TID) | ORAL | 2 refills | Status: DC

## 2017-09-09 NOTE — Telephone Encounter (Signed)
Should be ok to take the flexeril

## 2017-09-09 NOTE — Progress Notes (Signed)
Subjective: Alexandra Cain is a 72 y.o. is seen today in office s/p right 2nd metatarsal ORIF preformed on 08/28/2017.  She states that she is having discomfort but the Percocet is making her sick.  She has been trying to hold off on taking as much pain medicine as able.  She is remained in the cam boot and been nonweightbearing.  She been taking Phenergan for nausea she has been taking the antibiotic as well.  Overall she has had some nausea and she had some vomiting the day after surgery but has not thrown up since.  Denies any systemic complaints such as fevers, chills, nausea, vomiting. No calf pain, chest pain, shortness of breath.   Objective: General: No acute distress, AAOx3  DP/PT pulses palpable 2/4, CRT < 3 sec to all digits.  Protective sensation intact. Motor function intact.  Right foot: Incision is well coapted without any evidence of dehiscence and sutures are intact. There is no surrounding erythema, ascending cellulitis, fluctuance, crepitus, malodor, drainage/purulence. There is mild edema around the surgical site. There is moderate pain along the surgical site.  She states that the swelling is not much worse than what it was prior to surgery after she evaluated.  There is mild ecchymosis present. No other areas of tenderness to bilateral lower extremities.  No other open lesions or pre-ulcerative lesions.  No pain with calf compression, swelling, warmth, erythema.  No signs of DVT today.  Assessment and Plan:  Status post right foot 2nd metatarsal ORIF, doing well with no complications   -Treatment options discussed including all alternatives, risks, and complications -X-ray was obtained and reviewed.  Hardware intact.  Radial lucent line present the second metatarsal.  No other evidence of acute fracture. -Incision is healing well.  Antibiotic ointment was applied followed by a bandage.  Keep the dressing clean, dry, intact -Continue nonweightbearing in cam boot at all  times. -Ice/elevation -Pain medication as needed-she is going to try cutting the Percocets in half.  Refilled Phenergan.  Once antibiotics complete she does not need any further antibiotics. -Monitor for any clinical signs or symptoms of infection and DVT/PE and directed to call the office immediately should any occur or go to the ER. -Follow-up in 1 week or sooner if any problems arise. In the meantime, encouraged to call the office with any questions, concerns, change in symptoms.   Celesta Gentile, DPM

## 2017-09-11 NOTE — Progress Notes (Signed)
Subjective: Alexandra Cain is a 72 y.o. is seen today in office s/p right 2nd metatarsal ORIF preformed on 08/28/2017.  She states that she is still getting quite a bit of pain on the surgical site.  She feels that when the boot was pressure over the incision when she gets discomfort.  She is been taking the Flexeril and this is not been helping.  She feels she only needs pain medication at nighttime.  She is very anxious about the surgery and she just wants the area to heal.  She is anxious to get to start walking her dog again overall she feels well and she has no systemic complaints including fevers, chills, nausea, vomiting.  She denies any calf pain, chest pain, shortness of breath.  She has no other concerns today.  Objective: General: No acute distress, AAOx3- she is anxious today (she has been very anxious since the first appointment when I met her).  DP/PT pulses palpable 2/4, CRT < 3 sec to all digits.  Protective sensation intact. Motor function intact.  Right foot: Incision is well coapted without any evidence of dehiscence and sutures are intact and there is no drainage or pus.  There is a mild amount of erythema on the proximal aspect of the incision.  This is actually overworking palpate the hardware which is new today.  There is actually no significant discomfort along the area where it is red but there is tenderness on the other areas of the incision.  There is no increase in warmth there is no ascending sialitis.  There is no fluctuation or crepitation.  There is no malodor.  Mild edema to the foot and mild ecchymosis but overall minimal. No other open lesions or pre-ulcerative lesions.  No pain with calf compression, swelling, warmth, erythema.  No signs of DVT today.  Assessment and Plan:  Status post right foot 2nd metatarsal ORIF, mild erythema  -Treatment options discussed including all alternatives, risks, and complications -Incision appears to be healing well.  There is mild  erythema but there is no pain directly along the area where there is erythema.  As this is from pressure from where the hardware is slightly palpable.  In case of infection will start antibiotics.  Prescribed Keflex for her today.  Antibiotic ointment was applied followed by a bandage.  I try to offload the area as well with the bandage.  Continue ice and elevation.  Pain medication as needed.  She feels that she only uses at nighttime and I prescribed Vicodin for her.  She is very anxious about the surgery and I think that she is not having as much pain but more anxiety. -Monitor for any clinical signs or symptoms of infection and directed to call the office immediately should any occur or go to the ER. -I will see her back on Friday to check the incision is clean.  I encouraged her to call any questions or concerns or any change in symptoms  Trula Slade DPM

## 2017-09-13 ENCOUNTER — Ambulatory Visit (INDEPENDENT_AMBULATORY_CARE_PROVIDER_SITE_OTHER): Payer: Medicare Other | Admitting: Podiatry

## 2017-09-13 ENCOUNTER — Encounter: Payer: Self-pay | Admitting: Podiatry

## 2017-09-13 DIAGNOSIS — S92301G Fracture of unspecified metatarsal bone(s), right foot, subsequent encounter for fracture with delayed healing: Secondary | ICD-10-CM

## 2017-09-13 DIAGNOSIS — Z9889 Other specified postprocedural states: Secondary | ICD-10-CM

## 2017-09-16 NOTE — Progress Notes (Signed)
Subjective: Alexandra Cain is a 72 y.o. is seen today in office s/p right 2nd metatarsal ORIF preformed on 08/28/2017.  Overall she does feel that her pain has improved some.  She has been taking antibiotics and she is remained nonweightbearing in the cam boot.  She has been trying ice and elevate her foot as much as possible.  She takes pain medicine only at nighttime she takes Tylenol during the day.  She has no new concerns.  She presents today for wound check.  Overall she appears to be less anxious today. She denies any systemic complaints including fevers, chills, nausea, vomiting.  She denies any calf pain, chest pain, shortness of breath.  She has no other concerns today.  Objective: General: No acute distress, AAOx3-overall she looks better and she appears to be less anxious. DP/PT pulses palpable 2/4, CRT < 3 sec to all digits.  Protective sensation intact. Motor function intact.  Right foot: Incision is well coapted without any evidence of dehiscence and sutures are intact and there is no drainage or pus.  The erythema that she is experiencing to the proximal incision is much improved and there is no increase in warmth or ascending cellulitis.  Incision appears to be healing well there is no drainage or pus or any surrounding erythema otherwise.  There is no fluctuation or crepitation.  There is no malodor.  No purulence.  Decreased tenderness palpation of the surgical site.  No other areas of tenderness identified today. No other open lesions or pre-ulcerative lesions.  No pain with calf compression, swelling, warmth, erythema.  No signs of DVT today.  Assessment and Plan:  Status post right foot 2nd metatarsal ORIF, mild erythema with significant improvement  -Treatment options discussed including all alternatives, risks, and complications -Incision is healing well.  There is decreased erythema.  I want him to finish the course of antibiotics.  Antibiotic ointment was applied to the incision  followed by dry sterile dressing.  Keep the dressing clean, dry, intact.  I did try to offload the incision.  I think that part of this irritation is from when she is nonweightbearing using the boot and applying pressure to the incision site.  Continue nonweightbearing in the cam boot at all times.  Ice and elevation.  Pain medication as needed. -I will see her back in 1 week or sooner if needed.  Call any questions or concerns.  Today she states she is thankful and she has no further questions today.  Trula Slade DPM

## 2017-09-19 ENCOUNTER — Ambulatory Visit (INDEPENDENT_AMBULATORY_CARE_PROVIDER_SITE_OTHER): Payer: Medicare Other

## 2017-09-19 ENCOUNTER — Ambulatory Visit (INDEPENDENT_AMBULATORY_CARE_PROVIDER_SITE_OTHER): Payer: Medicare Other | Admitting: Podiatry

## 2017-09-19 DIAGNOSIS — S92301G Fracture of unspecified metatarsal bone(s), right foot, subsequent encounter for fracture with delayed healing: Secondary | ICD-10-CM

## 2017-09-19 DIAGNOSIS — M79671 Pain in right foot: Secondary | ICD-10-CM

## 2017-09-23 NOTE — Progress Notes (Signed)
Subjective: Alexandra Cain is a 72 y.o. is seen today in office s/p right 2nd metatarsal ORIF preformed on 08/28/2017.  She states that she is doing about the same.  She has not been taking pain medicine the last 2 days and only been taking Tylenol.  She has remained nonweightbearing and she is been using the cam boot.  She denies any recent injury or trauma.  She has no new concerns today. She denies any systemic complaints including fevers, chills, nausea, vomiting.  She denies any calf pain, chest pain, shortness of breath.  She has no other concerns today.  Objective: General: No acute distress, AAOx3-presents today with her husband and very anxious still. DP/PT pulses palpable 2/4, CRT < 3 sec to all digits.  Protective sensation intact. Motor function intact.  Right foot: Incision is well coapted without any evidence of dehiscence and sutures are intact and there is no drainage or pus.  Incision appears to be healing well.  There is no surrounding erythema, ascending sialitis.  The area of previous erythema has resolved.  There is no fluctuation or crepitation.  There is no malodor.  No clinical signs of infection noted.  Mild swelling to the foot still.  No other areas of tenderness identified today. No other open lesions or pre-ulcerative lesions.  No pain with calf compression, swelling, warmth, erythema.  No signs of DVT.  Assessment and Plan:  Status post right foot 2nd metatarsal ORIF  -Treatment options discussed including all alternatives, risks, and complications -X-rays were obtained and reviewed.  There does appear to be increased consolidation across the fracture site.  Hardware intact. -This point the incision appears to be doing well and is able to remove all the sutures today without any complications and after removal the incision remains well coapted.  Antibiotic ointment was applied followed by a bandage.  Keep the dressing clean, dry, intact -Continue cam boot and  nonweightbearing at all times -Pain medication as needed.  Discussed using a couple pain pills and have she is to take one at nighttime. -I will see her back in 1 week or sooner if needed.  Call any questions or concerns.  Today she states she is thankful and she has no further questions today.  Trula Slade DPM

## 2017-09-26 ENCOUNTER — Ambulatory Visit (INDEPENDENT_AMBULATORY_CARE_PROVIDER_SITE_OTHER): Payer: Medicare Other | Admitting: Podiatry

## 2017-09-26 ENCOUNTER — Encounter: Payer: Self-pay | Admitting: Podiatry

## 2017-09-26 ENCOUNTER — Other Ambulatory Visit: Payer: Self-pay | Admitting: Sports Medicine

## 2017-09-26 VITALS — BP 99/61 | HR 88 | Temp 98.3°F | Resp 16

## 2017-09-26 DIAGNOSIS — S92301G Fracture of unspecified metatarsal bone(s), right foot, subsequent encounter for fracture with delayed healing: Secondary | ICD-10-CM

## 2017-09-26 MED ORDER — ACETAMINOPHEN-CODEINE #3 300-30 MG PO TABS: 1.0000 | ORAL_TABLET | Freq: Three times a day (TID) | ORAL | 0 refills | Status: DC | PRN

## 2017-09-26 NOTE — Progress Notes (Signed)
Patient called answering service stating that she saw Dr. Jacqualyn Posey today and her Tylenol with Codeine has not been called in to the pharmacy. I discussed this with Dr. Jacqualyn Posey and he will send this Rx to pharmacy for the patient. -Dr. Cannon Kettle

## 2017-09-28 NOTE — Progress Notes (Signed)
Subjective: Alexandra Cain is a 72 y.o. is seen today in office s/p right 2nd metatarsal ORIF preformed on 08/28/2017. She states that she is doing much better this week. Her pain is improved but she still feels like she needs pain medication at night. She stopped the vicodin because she feels it made her 'heart skip a beat' and she had itching. She has remained NWB and with the CAM boot at all times. She is not on any antibiotics. She is doing better today. She reports no recent injury. She has no other concerns. She denies any systemic complaints including fevers, chills, nausea, vomiting.  She denies any calf pain, chest pain, shortness of breath.  She has no other concerns today.  Objective: General: No acute distress, AAOx3- is in much better spirits today.  DP/PT pulses palpable 2/4, CRT < 3 sec to all digits.  Protective sensation intact. Motor function intact.  Right foot: Incision is well coapted without any evidence of dehiscence. Some scab formation along the incision is starting to come off but the incision is closed and upon moving the incision it appears that the incision is together. There is no drainage or pus There is no surrounding erythema, ascending cellulitis.  The area of previous erythema has resolved.  There is no fluctuation or crepitation.  There is no malodor.  No clinical signs of infection noted.  Mild swelling to the foot still but overall the swelling is improved today as well. I can still palpate the plate along the proximal aspect of the incision.  No other areas of tenderness identified today. No other open lesions or pre-ulcerative lesions.  No pain with calf compression, swelling, warmth, erythema.  No signs of DVT.  Assessment and Plan:  Status post right foot 2nd metatarsal ORIF- reporting improved symptoms.   -Treatment options discussed including all alternatives, risks, and complications -I did debride some of the lose scab formation along the incision without any  complications or bleeding. Steri-stips were applied for reinforcement followed by a small amount of antibiotic ointment and a DSD.  -Remain NWB in CAM boot.  -Ice/elevation -Pain medication as needed.  Tylenol #3 was prescribed.  -I will see her back in 1 week or sooner if needed.  Call any questions or concerns.   Trula Slade DPM

## 2017-10-03 ENCOUNTER — Ambulatory Visit (INDEPENDENT_AMBULATORY_CARE_PROVIDER_SITE_OTHER): Payer: Medicare Other | Admitting: Podiatry

## 2017-10-03 ENCOUNTER — Encounter: Payer: Self-pay | Admitting: Podiatry

## 2017-10-03 ENCOUNTER — Ambulatory Visit (INDEPENDENT_AMBULATORY_CARE_PROVIDER_SITE_OTHER): Payer: Medicare Other

## 2017-10-03 VITALS — Temp 97.8°F

## 2017-10-03 DIAGNOSIS — S82891G Other fracture of right lower leg, subsequent encounter for closed fracture with delayed healing: Secondary | ICD-10-CM

## 2017-10-03 DIAGNOSIS — Z9889 Other specified postprocedural states: Secondary | ICD-10-CM

## 2017-10-07 NOTE — Progress Notes (Signed)
Subjective: Alexandra Cain is a 72 y.o. is seen today in office s/p right 2nd metatarsal ORIF preformed on 08/28/2017. She states "I am better today".  She has not been taking any pain medications in the last saw her.  She still takes Tylenol during the day as needed.  She is remained nonweightbearing in the cam boot.  She denies any recent injuries or falls.  She has no new concerns and she feels that overall she feels much better.  She continues to deny any systemic complaints including fevers, chills, nausea, vomiting.  She denies any calf pain, chest pain, shortness of breath.  Objective: General: No acute distress, AAOx3- is in much better spirits today.  DP/PT pulses palpable 2/4, CRT < 3 sec to all digits.  Protective sensation intact. Motor function intact.  Right foot: Incision is well coapted without any evidence of dehiscence.  There is some mild macerated tissue on the incision.  The incision itself still has almost like a small granular appearance to it and this is from where the scab has come off.  Upon stressing the incision there is no opening within the incision appears to be coapted and this appears to be the very superficial layers of the skin.  There is also still a slight amount of erythema along the proximal incision but this is likely from where the plate is rubbing that she could palpate.  There is no pain to palpation of this area there is no increase in warmth.  There is no drainage or pus.  There is no ascending cellulitis.  No fluctuation or crepitation.  There is no malodor. I am unable to elicit any tenderness to palpation to the incision today. No other areas of tenderness identified today. No other open lesions or pre-ulcerative lesions.  No pain with calf compression, swelling, warmth, erythema.  No signs of DVT.  Assessment and Plan:  Status post right foot 2nd metatarsal ORIF-improved  -Treatment options discussed including all alternatives, risks, and complications -I  remove the Steri-Strips in the incision today.  Area was cleaned.  I did apply just a couple of Steri-Strips to reinforce the incision and as well as Betadine to the incision followed by dry sterile dressing.  I want her to start dressing changes as well.  The incision was getting too moist and I think it is wise not completely scarred over at this point. -Continue cam boot and nonweightbearing at all times -Continue ice and elevation -Pain medication as needed.  At this point she is only taking Tylenol -See her back in 1 week or sooner if any issues are to arise.  Call any questions or concerns.  Trula Slade DPM   -I did debride some of the lose scab formation along the incision without any complications or bleeding. Steri-stips were applied for reinforcement followed by a small amount of antibiotic ointment and a DSD.  -Remain NWB in CAM boot.  -Ice/elevation -Pain medication as needed.  Tylenol #3 was prescribed.  -I will see her back in 1 week or sooner if needed.  Call any questions or concerns.   Trula Slade DPM

## 2017-10-10 ENCOUNTER — Ambulatory Visit (INDEPENDENT_AMBULATORY_CARE_PROVIDER_SITE_OTHER): Payer: Medicare Other | Admitting: Podiatry

## 2017-10-10 ENCOUNTER — Encounter: Payer: Self-pay | Admitting: Podiatry

## 2017-10-10 DIAGNOSIS — S82891G Other fracture of right lower leg, subsequent encounter for closed fracture with delayed healing: Secondary | ICD-10-CM

## 2017-10-14 NOTE — Progress Notes (Signed)
Subjective: Alexandra Cain is a 72 y.o. is seen today in office s/p right 2nd metatarsal ORIF preformed on 08/28/2017.  She states that she continues to improve.  She is not taking any pain medication.  Her only concern is that she keeps her foot hanging down she noticed purple discoloration at times but this resolved when she keeps her foot elevated.  She is been changing the bandage every day a small amount of Betadine to the incision.  Her husband states that when she changes the bandage the scab comes off.  Denies any drainage or pus there is any redness or red streaks.  No recent injury.  She is remained nonweightbearing.  Overall she feels well. Denies any systemic complaints including fevers, chills, nausea, vomiting.  She denies any calf pain, chest pain, shortness of breath.  Objective: General: No acute distress, AAOx3- is in much better spirits today.  DP/PT pulses palpable 2/4, CRT < 3 sec to all digits.  Protective sensation intact. Motor function intact.  Right foot: Incision is well coapted without any evidence of dehiscence.  Some of the granulation tissue present along the incision however this from with a scab, but upon stressing the incision there is no opening or movement across the incision.  Still able to palpate the proximal aspect of the plate.  There is no pain along the incision or along the surgical site or to the foot in general.  Minimal edema.  There is good temperature to the foot.  There is no drainage or pus or any clinical signs of infection present. No other open lesions or pre-ulcerative lesions.  No pain with calf compression, swelling, warmth, erythema.  No signs of DVT.  Assessment and Plan:  Status post right foot 2nd metatarsal ORIF-continuing to improve  -Treatment options discussed including all alternatives, risks, and complications -She has no pain on exam today.  Incision is healing.  I want her to continue with daily dressing changes with a small amount of  Betadine.  Discussed that the bandage sticks to apply small once daily which I gave her today.  -Continue nonweightbearing in cam boot at all times. -Continue ice and elevate -Pain medication as needed -I will see her back in 1 week.  We will get repeat x-rays next appointment.  Hopefully can start to put some weight on the foot and start some physical therapy.  Trula Slade DPM

## 2017-10-17 ENCOUNTER — Ambulatory Visit (INDEPENDENT_AMBULATORY_CARE_PROVIDER_SITE_OTHER): Payer: Medicare Other

## 2017-10-17 ENCOUNTER — Ambulatory Visit (INDEPENDENT_AMBULATORY_CARE_PROVIDER_SITE_OTHER): Payer: Medicare Other | Admitting: Podiatry

## 2017-10-17 DIAGNOSIS — S82891G Other fracture of right lower leg, subsequent encounter for closed fracture with delayed healing: Secondary | ICD-10-CM

## 2017-10-17 DIAGNOSIS — Z9889 Other specified postprocedural states: Secondary | ICD-10-CM

## 2017-10-17 NOTE — Progress Notes (Signed)
Subjective: Alexandra Cain is a 72 y.o. is seen today in office s/p right 2nd metatarsal ORIF preformed on 08/28/2017.  She states that she is doing well.  Her husband is continue with daily dressing changes with Betadine to the wound daily.  She states that she gets some swelling and pain to her toes when she keeps her foot down she will notice that her foot turns red as she keeps her foot hanging down for some time.  She has been nonweightbearing in the cam boot.  She denies any recent injury or fall she has no other concerns today. Denies any systemic complaints including fevers, chills, nausea, vomiting.  She denies any calf pain, chest pain, shortness of breath.  Objective: General: No acute distress, AAOx3- is in much better spirits today.  DP/PT pulses palpable 2/4, CRT < 3 sec to all digits.  Protective sensation intact. Motor function intact.  Right foot: Incision is well coapted without any evidence of dehiscence.  The scar is forming along the incision and there is no motion across the incision appears to be well coapted.  Small erythema on the proximal incision but it is overlying the area of palpable hardware not directly related to the incision but there is no skin breakdown of this area.  There is no drainage or pus.  There does appear to have some purplish discoloration of the foot there is normal temperature and there is no excessive drying or swelling of the skin.  I think this color changes more due to atrophy as she has not been putting any weight on her foot for the last several months.  She has no pain to the foot in general.  There is no tenderness palpation of the surgical site or along the area of the palpable hardware.  Adequate symmetry of the foot.  Mild swelling. No other open lesions or pre-ulcerative lesions.  No pain with calf compression, swelling, warmth, erythema.  No signs of DVT.  Assessment and Plan:  Status post right foot 2nd metatarsal ORIF-continuing to  improve  -Treatment options discussed including all alternatives, risks, and complications -X-rays were obtained and reviewed.  There is no evidence of acute fracture and hardware is intact the second metatarsal that has been to be increased consolidation across the fracture site. -Discussed with her she can start partial weightbearing although small amount of time at home.  Also insert physical therapy.  Prescription was provided for therapy today.  She will hopefully start this next week. -Continue to ice and elevate. -She can start to clean the foot daily with soap and water dry thoroughly.  Continue small amount of Betadine to the area daily but she can wear the area uncovered at nighttime around the house. -Monitor for any clinical signs or symptoms of infection and directed to call the office immediately should any occur or go to the ER. -RTC 1 week or sooner if needed.  Trula Slade DPM

## 2017-10-24 ENCOUNTER — Ambulatory Visit (INDEPENDENT_AMBULATORY_CARE_PROVIDER_SITE_OTHER): Payer: Medicare Other

## 2017-10-24 ENCOUNTER — Ambulatory Visit (INDEPENDENT_AMBULATORY_CARE_PROVIDER_SITE_OTHER): Payer: Medicare Other | Admitting: Podiatry

## 2017-10-24 ENCOUNTER — Encounter: Payer: Self-pay | Admitting: Podiatry

## 2017-10-24 VITALS — BP 134/74 | HR 80 | Temp 98.4°F | Resp 18

## 2017-10-24 DIAGNOSIS — S82891G Other fracture of right lower leg, subsequent encounter for closed fracture with delayed healing: Secondary | ICD-10-CM

## 2017-10-29 DIAGNOSIS — M25674 Stiffness of right foot, not elsewhere classified: Secondary | ICD-10-CM | POA: Diagnosis not present

## 2017-10-29 DIAGNOSIS — M25671 Stiffness of right ankle, not elsewhere classified: Secondary | ICD-10-CM | POA: Diagnosis not present

## 2017-10-29 DIAGNOSIS — M25474 Effusion, right foot: Secondary | ICD-10-CM | POA: Diagnosis not present

## 2017-10-29 DIAGNOSIS — M79671 Pain in right foot: Secondary | ICD-10-CM | POA: Diagnosis not present

## 2017-10-29 NOTE — Progress Notes (Signed)
Subjective: Alexandra Cain is a 72 y.o. is seen today in office s/p right 2nd metatarsal ORIF preformed on 08/28/2017.  She states that she did fall a couple days ago.  She states that she is doing better.  Her husband has been changed with arrangements were made at bedtime.  They have not noticed any opening on the incision.  She states that she has not noticed any increase in swelling that she has had no redness to her feet.  She denies any warmth.  She has not been having any pain.  She has not has any open sores.  She has no other concerns.  Denies any systemic complaints including fevers, chills, nausea, vomiting.  She denies any calf pain, chest pain, shortness of breath.  Objective: General: No acute distress, AAOx3- is in much better spirits today.  DP/PT pulses palpable 2/4, CRT < 3 sec to all digits.  Protective sensation intact. Motor function intact.  Right foot: Incision is well coapted without any evidence of dehiscence.  This corresponds on the area and along trying to stress the incision there was no opening or movement across the incision.  While the proximal aspect of the incision used to palpate the plate however there is no significant erythema there is no pain to this area.  Overall there is some discoloration of the foot there is no increased warmth there is no excessive sweating or drying.  There is no pain to other areas but there is no pain out of proportion. No other open lesions or pre-ulcerative lesions.  No pain with calf compression, swelling, warmth, erythema.  No signs of DVT.  Assessment and Plan:  Status post right foot 2nd metatarsal ORIF-continuing to improve  -Treatment options discussed including all alternatives, risks, and complications -X-rays were obtained and reviewed.  Hardware intact.  There is a screw to be increased consolidation across the fracture of the second metatarsal.  Osteopenia is present.  No evidence of acute fracture identified today.  There is a  radiolucent line along the base of the first metatarsal but I think this is more than osteopenia.  There is no tenderness palpation posterior clinical exam.  In regards to her surgery will he recommend start physical therapy and she is in start therapy next week.  She did hold off on this week because she fell.  There is no increase in pain since the fall.  Also we are and start partial weightbearing as she can tolerate it.  Continue to ice and elevate. -Monitor for any clinical signs or symptoms of infection and directed to call the office immediately should any occur or go to the ER. -RTC 1 week or sooner if needed.  Trula Slade DPM

## 2017-10-31 ENCOUNTER — Ambulatory Visit (INDEPENDENT_AMBULATORY_CARE_PROVIDER_SITE_OTHER): Payer: Medicare Other

## 2017-10-31 ENCOUNTER — Ambulatory Visit (INDEPENDENT_AMBULATORY_CARE_PROVIDER_SITE_OTHER): Payer: Medicare Other | Admitting: Podiatry

## 2017-10-31 DIAGNOSIS — Z9889 Other specified postprocedural states: Secondary | ICD-10-CM

## 2017-10-31 DIAGNOSIS — S92301G Fracture of unspecified metatarsal bone(s), right foot, subsequent encounter for fracture with delayed healing: Secondary | ICD-10-CM | POA: Diagnosis not present

## 2017-11-01 DIAGNOSIS — M25474 Effusion, right foot: Secondary | ICD-10-CM | POA: Diagnosis not present

## 2017-11-01 DIAGNOSIS — M79671 Pain in right foot: Secondary | ICD-10-CM | POA: Diagnosis not present

## 2017-11-01 DIAGNOSIS — M25671 Stiffness of right ankle, not elsewhere classified: Secondary | ICD-10-CM | POA: Diagnosis not present

## 2017-11-01 DIAGNOSIS — M25674 Stiffness of right foot, not elsewhere classified: Secondary | ICD-10-CM | POA: Diagnosis not present

## 2017-11-03 NOTE — Progress Notes (Signed)
Subjective: Alexandra Cain is a 72 y.o. is seen today in office s/p right 2nd metatarsal ORIF preformed on 08/28/2017.  She states that overall she is feeling better.  She has been putting a little bit of weight on the foot partially and has having no pain.  Overall she states that the color to her foot looks much better and overall she feels that she is doing much better.  She has not been taking any pain medication.  She did go to physical therapy one time and she states this was very helpful and she is scheduled to go back tomorrow.  She has no other concerns.  No recent injury. Denies any systemic complaints including fevers, chills, nausea, vomiting.  She denies any calf pain, chest pain, shortness of breath.  Objective: General: No acute distress, AAOx3- in good spirits today and with her husband.  DP/PT pulses palpable 2/4, CRT < 3 sec to all digits.  Protective sensation intact. Motor function intact.  Right foot: Incision is well coapted without any evidence of dehiscence the scar is well formed.  Along the proximal aspect the incision is still able to palpate hardware however there is no skin breakdown but overall the skin appears to be much improved.  There is more normal coloration to the skin identified today.  Unable to elicit any area of tenderness to palpation of the right foot.  Normal swelling.  Overall she is doing much better. No other open lesions or pre-ulcerative lesions.  No pain with calf compression, swelling, warmth, erythema.  No signs of DVT.  Assessment and Plan:  Status post right foot 2nd metatarsal ORIF-continuing to improve  -Treatment options discussed including all alternatives, risks, and complications -X-rays were obtained and reviewed.  Hardware intact.  Increased consolidation across the fracture site.  No other evidence of acute fracture identified. -She is doing much better.  I want her to continue physical therapy.  She can start to transition to partial  weightbearing in the boot as she progresses with physical therapy she can start to walk in the boot.  She can get the incision wet.  I want her to wash with soap and water dry thoroughly.  Continue to ice and elevate. -Follow-up in 2 weeks or sooner if needed.  Call any questions or concerns.  She agrees with this plan.  Trula Slade DPM

## 2017-11-04 DIAGNOSIS — M25474 Effusion, right foot: Secondary | ICD-10-CM | POA: Diagnosis not present

## 2017-11-04 DIAGNOSIS — M25671 Stiffness of right ankle, not elsewhere classified: Secondary | ICD-10-CM | POA: Diagnosis not present

## 2017-11-04 DIAGNOSIS — M79671 Pain in right foot: Secondary | ICD-10-CM | POA: Diagnosis not present

## 2017-11-04 DIAGNOSIS — M25674 Stiffness of right foot, not elsewhere classified: Secondary | ICD-10-CM | POA: Diagnosis not present

## 2017-11-07 ENCOUNTER — Encounter: Payer: Medicare Other | Admitting: Podiatry

## 2017-11-07 DIAGNOSIS — M79671 Pain in right foot: Secondary | ICD-10-CM | POA: Diagnosis not present

## 2017-11-07 DIAGNOSIS — M25671 Stiffness of right ankle, not elsewhere classified: Secondary | ICD-10-CM | POA: Diagnosis not present

## 2017-11-07 DIAGNOSIS — M25674 Stiffness of right foot, not elsewhere classified: Secondary | ICD-10-CM | POA: Diagnosis not present

## 2017-11-07 DIAGNOSIS — M25474 Effusion, right foot: Secondary | ICD-10-CM | POA: Diagnosis not present

## 2017-11-12 DIAGNOSIS — M79671 Pain in right foot: Secondary | ICD-10-CM | POA: Diagnosis not present

## 2017-11-12 DIAGNOSIS — M25674 Stiffness of right foot, not elsewhere classified: Secondary | ICD-10-CM | POA: Diagnosis not present

## 2017-11-12 DIAGNOSIS — M25671 Stiffness of right ankle, not elsewhere classified: Secondary | ICD-10-CM | POA: Diagnosis not present

## 2017-11-12 DIAGNOSIS — M25474 Effusion, right foot: Secondary | ICD-10-CM | POA: Diagnosis not present

## 2017-11-14 ENCOUNTER — Ambulatory Visit (INDEPENDENT_AMBULATORY_CARE_PROVIDER_SITE_OTHER): Payer: Medicare Other

## 2017-11-14 ENCOUNTER — Ambulatory Visit (INDEPENDENT_AMBULATORY_CARE_PROVIDER_SITE_OTHER): Payer: Medicare Other | Admitting: Podiatry

## 2017-11-14 DIAGNOSIS — M25674 Stiffness of right foot, not elsewhere classified: Secondary | ICD-10-CM | POA: Diagnosis not present

## 2017-11-14 DIAGNOSIS — S92301G Fracture of unspecified metatarsal bone(s), right foot, subsequent encounter for fracture with delayed healing: Secondary | ICD-10-CM

## 2017-11-14 DIAGNOSIS — Z9889 Other specified postprocedural states: Secondary | ICD-10-CM | POA: Diagnosis not present

## 2017-11-14 DIAGNOSIS — M79671 Pain in right foot: Secondary | ICD-10-CM | POA: Diagnosis not present

## 2017-11-14 DIAGNOSIS — M25474 Effusion, right foot: Secondary | ICD-10-CM | POA: Diagnosis not present

## 2017-11-14 DIAGNOSIS — M25671 Stiffness of right ankle, not elsewhere classified: Secondary | ICD-10-CM | POA: Diagnosis not present

## 2017-11-15 NOTE — Progress Notes (Signed)
Subjective: Alexandra Cain is a 73 y.o. is seen today in office s/p right 2nd metatarsal ORIF preformed on 08/28/2017.  She is been she does feel that she is doing better and this is been improving.  She states that she has been walking in the cam boot with crutches for assistance.  She walks about 20 steps a day without crutches and just in the cam boot.  She has no pain in the surgical site but she points to her ankle she gets some discomfort.  She denies any recent injury or fall.  She has no other concerns today. Denies any systemic complaints including fevers, chills, nausea, vomiting.  She denies any calf pain, chest pain, shortness of breath.  Objective: General: No acute distress, AAOx3-presents with her husband. DP/PT pulses palpable 2/4, CRT < 3 sec to all digits.  Protective sensation intact. Motor function intact.  Right foot: Incision is well coapted without any evidence of dehiscence the scar is well formed.  Overall the color to her skin is much improved.  There is no tenderness palpation along the surgical site.  However the hardware is still palpable on the proximal incision.  Mild discomfort in the anterior ankle joint line objectively with weightbearing or pressure but there is no pain today there is no edema, erythema to this area.  No other area tenderness.  Minimal swelling to the foot  No other open lesions or pre-ulcerative lesions.  No pain with calf compression, swelling, warmth, erythema.  No signs of DVT.  Assessment and Plan:  Status post right foot 2nd metatarsal ORIF-continuing to improve  -Treatment options discussed including all alternatives, risks, and complications -X-rays were obtained and reviewed.  Hardware intact.  Increased consolidation across the fracture site.  No other evidence of acute fracture identified. -Stable continue physical therapy.  She can slowly transition to walking in the cam boot as she is able and she increases her strength.  She gets some  overall soreness from starting physical therapy.  Discussed anti-inflammatories and she can use over-the-counter ibuprofen for this. -Continue ice and elevate - she can also restart the bone stimulator. -Compression anklets were dispensed x2. -Monitor for any clinical signs or symptoms of infection and directed to call the office immediately should any occur or go to the ER. -RTC 2 weeks or sooner if needed.  Trula Slade DPM

## 2017-11-19 DIAGNOSIS — M79671 Pain in right foot: Secondary | ICD-10-CM | POA: Diagnosis not present

## 2017-11-19 DIAGNOSIS — M25674 Stiffness of right foot, not elsewhere classified: Secondary | ICD-10-CM | POA: Diagnosis not present

## 2017-11-19 DIAGNOSIS — M25671 Stiffness of right ankle, not elsewhere classified: Secondary | ICD-10-CM | POA: Diagnosis not present

## 2017-11-19 DIAGNOSIS — M25474 Effusion, right foot: Secondary | ICD-10-CM | POA: Diagnosis not present

## 2017-11-21 ENCOUNTER — Encounter: Payer: Medicare Other | Admitting: Podiatry

## 2017-11-21 DIAGNOSIS — M25474 Effusion, right foot: Secondary | ICD-10-CM | POA: Diagnosis not present

## 2017-11-21 DIAGNOSIS — M79671 Pain in right foot: Secondary | ICD-10-CM | POA: Diagnosis not present

## 2017-11-21 DIAGNOSIS — M25671 Stiffness of right ankle, not elsewhere classified: Secondary | ICD-10-CM | POA: Diagnosis not present

## 2017-11-21 DIAGNOSIS — M25674 Stiffness of right foot, not elsewhere classified: Secondary | ICD-10-CM | POA: Diagnosis not present

## 2017-11-26 DIAGNOSIS — M25474 Effusion, right foot: Secondary | ICD-10-CM | POA: Diagnosis not present

## 2017-11-26 DIAGNOSIS — M79671 Pain in right foot: Secondary | ICD-10-CM | POA: Diagnosis not present

## 2017-11-26 DIAGNOSIS — M25671 Stiffness of right ankle, not elsewhere classified: Secondary | ICD-10-CM | POA: Diagnosis not present

## 2017-11-26 DIAGNOSIS — M25674 Stiffness of right foot, not elsewhere classified: Secondary | ICD-10-CM | POA: Diagnosis not present

## 2017-11-28 ENCOUNTER — Ambulatory Visit (INDEPENDENT_AMBULATORY_CARE_PROVIDER_SITE_OTHER): Payer: Medicare Other

## 2017-11-28 ENCOUNTER — Ambulatory Visit (INDEPENDENT_AMBULATORY_CARE_PROVIDER_SITE_OTHER): Payer: Medicare Other | Admitting: Podiatry

## 2017-11-28 ENCOUNTER — Encounter: Payer: Self-pay | Admitting: Podiatry

## 2017-11-28 ENCOUNTER — Encounter: Payer: Medicare Other | Admitting: Podiatry

## 2017-11-28 DIAGNOSIS — M25474 Effusion, right foot: Secondary | ICD-10-CM | POA: Diagnosis not present

## 2017-11-28 DIAGNOSIS — S92301G Fracture of unspecified metatarsal bone(s), right foot, subsequent encounter for fracture with delayed healing: Secondary | ICD-10-CM | POA: Diagnosis not present

## 2017-11-28 DIAGNOSIS — Z9889 Other specified postprocedural states: Secondary | ICD-10-CM

## 2017-11-28 DIAGNOSIS — M25674 Stiffness of right foot, not elsewhere classified: Secondary | ICD-10-CM | POA: Diagnosis not present

## 2017-11-28 DIAGNOSIS — M79671 Pain in right foot: Secondary | ICD-10-CM | POA: Diagnosis not present

## 2017-11-28 DIAGNOSIS — M25671 Stiffness of right ankle, not elsewhere classified: Secondary | ICD-10-CM | POA: Diagnosis not present

## 2017-11-29 MED ORDER — IBUPROFEN-FAMOTIDINE 800-26.6 MG PO TABS: ORAL_TABLET | ORAL | 1 refills | Status: DC

## 2017-11-29 NOTE — Progress Notes (Signed)
Subjective: Alexandra Cain is a 72 y.o. is seen today in office s/p right 2nd metatarsal ORIF preformed on 08/28/2017.  She states that she has been doing well however she has been doing physical therapy and she had some mild increase in swelling and pain to her foot.  She was taking ibuprofen but this is upsetting her stomach.  She has been walking minimally in the boot using crutches to take pressure off the area.  She has not yet tried to walk without crutches.  Denies any recent injury or falls.  She still gets some pain the also aspect of her foot she points on the sinus tarsi where she gets discomfort.  Denies any systemic complaints including fevers, chills, nausea, vomiting.  She denies any calf pain, chest pain, shortness of breath.  Objective: General: No acute distress, AAOx3-presents with her husband. DP/PT pulses palpable 2/4, CRT < 3 sec to all digits.  Protective sensation intact. Motor function intact.  Right foot: Incision is well coapted without any evidence of dehiscence the scar is well formed.  There is no tenderness palpation of the surgical site.  There is mild discomfort on the sinus tarsi to the lateral aspect of the right foot but there is no area pinpoint bony tenderness.  Mild increase in swelling of the right foot compared to the contralateral extremity and the feet appear to be equal temperature. No other areas of tenderness identified today. No other open lesions or pre-ulcerative lesions.  No pain with calf compression, swelling, warmth, erythema.  No signs of DVT.  Assessment and Plan:  Status post right foot 2nd metatarsal ORIF-continuing to improve  -Treatment options discussed including all alternatives, risks, and complications -X-rays were obtained and reviewed.  Hardware intact.  Increased consolidation across the fracture site.  No other evidence of acute fracture identified. -At this point I want her to continue with physical therapy and get to the point where she  is just walking in the boot.  Discussed at this point I want her to start to use this more. Continue to ice and elevate as well as compression bandages.  As she goes into regular shoes she is make sure she is wearing a very supportive shoe.  We discussed a steroid injection sinus tarsi.  I ordered Duexis for anti-inflammatory.  Trula Slade DPM

## 2017-12-02 ENCOUNTER — Telehealth: Payer: Self-pay | Admitting: *Deleted

## 2017-12-02 NOTE — Telephone Encounter (Signed)
The alternative is fine.

## 2017-12-02 NOTE — Telephone Encounter (Signed)
I spoke with Alexandra Cain and she states she did not accept personal pay for the Duexis, it would have cost her $2400.00. I told Alexandra Cain I would check if Dr. Jacqualyn Posey would like to order Ibuprofen 800mg  tid and she take the Pepcid 20mg  tid also. Alexandra Cain agreed.

## 2017-12-03 ENCOUNTER — Other Ambulatory Visit: Payer: Self-pay

## 2017-12-03 DIAGNOSIS — M25671 Stiffness of right ankle, not elsewhere classified: Secondary | ICD-10-CM | POA: Diagnosis not present

## 2017-12-03 DIAGNOSIS — M25474 Effusion, right foot: Secondary | ICD-10-CM | POA: Diagnosis not present

## 2017-12-03 DIAGNOSIS — M25674 Stiffness of right foot, not elsewhere classified: Secondary | ICD-10-CM | POA: Diagnosis not present

## 2017-12-03 DIAGNOSIS — M79671 Pain in right foot: Secondary | ICD-10-CM | POA: Diagnosis not present

## 2017-12-03 MED ORDER — MEDROXYPROGESTERONE ACETATE 2.5 MG PO TABS: 2.5000 mg | ORAL_TABLET | Freq: Every day | ORAL | 2 refills | Status: DC

## 2017-12-03 MED ORDER — ESTRADIOL 0.5 MG PO TABS: 0.5000 mg | ORAL_TABLET | Freq: Every day | ORAL | 2 refills | Status: DC

## 2017-12-03 NOTE — Telephone Encounter (Signed)
Received faxed refill request from [facility] for the following--pt. Transferring to [facility]  Medication refill request: Provera 2.5mg  Last AEX:  06-18-17 Next AEX: 08-29-18 Last MMG (if hormonal medication request): 01-29-17 [id number] Refill authorized: Please advise   Medication refill request: Estradiol 0.5mg  Last AEX:  06-18-17 Next AEX: 08-29-18 Last MMG (if hormonal medication request): 01-29-17 [id number] Refill authorized: please advise

## 2017-12-05 DIAGNOSIS — M25474 Effusion, right foot: Secondary | ICD-10-CM | POA: Diagnosis not present

## 2017-12-05 DIAGNOSIS — M25674 Stiffness of right foot, not elsewhere classified: Secondary | ICD-10-CM | POA: Diagnosis not present

## 2017-12-05 DIAGNOSIS — M79671 Pain in right foot: Secondary | ICD-10-CM | POA: Diagnosis not present

## 2017-12-05 DIAGNOSIS — M25671 Stiffness of right ankle, not elsewhere classified: Secondary | ICD-10-CM | POA: Diagnosis not present

## 2017-12-09 DIAGNOSIS — M25671 Stiffness of right ankle, not elsewhere classified: Secondary | ICD-10-CM | POA: Diagnosis not present

## 2017-12-09 DIAGNOSIS — M25474 Effusion, right foot: Secondary | ICD-10-CM | POA: Diagnosis not present

## 2017-12-09 DIAGNOSIS — M79671 Pain in right foot: Secondary | ICD-10-CM | POA: Diagnosis not present

## 2017-12-09 DIAGNOSIS — M25674 Stiffness of right foot, not elsewhere classified: Secondary | ICD-10-CM | POA: Diagnosis not present

## 2017-12-12 ENCOUNTER — Ambulatory Visit (INDEPENDENT_AMBULATORY_CARE_PROVIDER_SITE_OTHER): Payer: Medicare Other | Admitting: Podiatry

## 2017-12-12 DIAGNOSIS — Z9889 Other specified postprocedural states: Secondary | ICD-10-CM

## 2017-12-12 DIAGNOSIS — M25671 Stiffness of right ankle, not elsewhere classified: Secondary | ICD-10-CM | POA: Diagnosis not present

## 2017-12-12 DIAGNOSIS — M25474 Effusion, right foot: Secondary | ICD-10-CM | POA: Diagnosis not present

## 2017-12-12 DIAGNOSIS — S92301G Fracture of unspecified metatarsal bone(s), right foot, subsequent encounter for fracture with delayed healing: Secondary | ICD-10-CM

## 2017-12-12 DIAGNOSIS — M25674 Stiffness of right foot, not elsewhere classified: Secondary | ICD-10-CM | POA: Diagnosis not present

## 2017-12-12 DIAGNOSIS — M79671 Pain in right foot: Secondary | ICD-10-CM | POA: Diagnosis not present

## 2017-12-12 NOTE — Progress Notes (Signed)
Subjective: Alexandra Cain is a 72 y.o. is seen today in office s/p right 2nd metatarsal ORIF preformed on 08/28/2017.  Today she presents today wearing a regular shoe carrying her crutches.  She states that she did try to wear her shoe more.  She states that she has no pain in the surgical site.  She still gets some pain to the also aspect of her foot where she points to the sinus tarsi.  She has been taking 2 Tylenol at nighttime that it for pain.  She denies any recent injury or trauma.  She is still doing physical therapy and she is scheduled for this afternoon as well.  She has no other concerns today.  No recent injury or falls.  Objective: General: No acute distress, AAOx3-presents with her husband. DP/PT pulses palpable 2/4, CRT < 3 sec to all digits.  Protective sensation intact. Motor function intact.  Right foot: Incision is well coapted without any evidence of dehiscence the scar is well formed.  There is no tenderness palpation along the surgical site.  There is no significant erythema along with the area of hardware is palpable there is no tenderness palpation of this area.  There is still some mild tenderness to the lateral aspect the foot on the sinus tarsi there is minimal swelling to this area but overall this appears to be improved as well.  Overall there is no other areas of tenderness to her foot and there is decrease edema and her skin color appears to be more normal today.  No other areas of tenderness. No other open lesions or pre-ulcerative lesions.  No pain with calf compression, swelling, warmth, erythema.  No signs of DVT.  Assessment and Plan:  Status post right foot 2nd metatarsal ORIF-continuing to improve  -Treatment options discussed including all alternatives, risks, and complications -At this time she is doing well.  I want her to continue to wear regular shoe and gradually increase the nighttime that she is wearing the shoe as well as walking without the crutches.   Continue physical therapy as well.  Continue compression ankle as well as elevation.  Tylenol as needed for pain. -Follow-up in 2 to 3 weeks or sooner if needed.  Call any questions or concerns.  She does report that she is having more hip and knee pain the symptoms continue I will refer to sports medicine orthopedics.  She wishes to hold off on that at this time.  Trula Slade DPM

## 2017-12-17 DIAGNOSIS — M25671 Stiffness of right ankle, not elsewhere classified: Secondary | ICD-10-CM | POA: Diagnosis not present

## 2017-12-17 DIAGNOSIS — M79671 Pain in right foot: Secondary | ICD-10-CM | POA: Diagnosis not present

## 2017-12-17 DIAGNOSIS — M25474 Effusion, right foot: Secondary | ICD-10-CM | POA: Diagnosis not present

## 2017-12-17 DIAGNOSIS — M25674 Stiffness of right foot, not elsewhere classified: Secondary | ICD-10-CM | POA: Diagnosis not present

## 2017-12-19 DIAGNOSIS — M79671 Pain in right foot: Secondary | ICD-10-CM | POA: Diagnosis not present

## 2017-12-19 DIAGNOSIS — M25474 Effusion, right foot: Secondary | ICD-10-CM | POA: Diagnosis not present

## 2017-12-19 DIAGNOSIS — M25674 Stiffness of right foot, not elsewhere classified: Secondary | ICD-10-CM | POA: Diagnosis not present

## 2017-12-19 DIAGNOSIS — M25671 Stiffness of right ankle, not elsewhere classified: Secondary | ICD-10-CM | POA: Diagnosis not present

## 2017-12-23 DIAGNOSIS — M25474 Effusion, right foot: Secondary | ICD-10-CM | POA: Diagnosis not present

## 2017-12-23 DIAGNOSIS — M25674 Stiffness of right foot, not elsewhere classified: Secondary | ICD-10-CM | POA: Diagnosis not present

## 2017-12-23 DIAGNOSIS — M25671 Stiffness of right ankle, not elsewhere classified: Secondary | ICD-10-CM | POA: Diagnosis not present

## 2017-12-23 DIAGNOSIS — M79671 Pain in right foot: Secondary | ICD-10-CM | POA: Diagnosis not present

## 2017-12-26 DIAGNOSIS — M25474 Effusion, right foot: Secondary | ICD-10-CM | POA: Diagnosis not present

## 2017-12-26 DIAGNOSIS — M25674 Stiffness of right foot, not elsewhere classified: Secondary | ICD-10-CM | POA: Diagnosis not present

## 2017-12-26 DIAGNOSIS — M25671 Stiffness of right ankle, not elsewhere classified: Secondary | ICD-10-CM | POA: Diagnosis not present

## 2017-12-26 DIAGNOSIS — M79671 Pain in right foot: Secondary | ICD-10-CM | POA: Diagnosis not present

## 2017-12-31 ENCOUNTER — Ambulatory Visit (INDEPENDENT_AMBULATORY_CARE_PROVIDER_SITE_OTHER): Payer: Medicare Other | Admitting: Podiatry

## 2017-12-31 ENCOUNTER — Ambulatory Visit (INDEPENDENT_AMBULATORY_CARE_PROVIDER_SITE_OTHER): Payer: Medicare Other

## 2017-12-31 DIAGNOSIS — H5213 Myopia, bilateral: Secondary | ICD-10-CM | POA: Diagnosis not present

## 2017-12-31 DIAGNOSIS — S92301G Fracture of unspecified metatarsal bone(s), right foot, subsequent encounter for fracture with delayed healing: Secondary | ICD-10-CM

## 2017-12-31 DIAGNOSIS — H2513 Age-related nuclear cataract, bilateral: Secondary | ICD-10-CM | POA: Diagnosis not present

## 2017-12-31 MED ORDER — GABAPENTIN 100 MG PO CAPS: 100.0000 mg | ORAL_CAPSULE | Freq: Every day | ORAL | 0 refills | Status: DC

## 2017-12-31 NOTE — Patient Instructions (Signed)
Gabapentin capsules or tablets What is this medicine? GABAPENTIN (GA ba pen tin) is used to control partial seizures in adults with epilepsy. It is also used to treat certain types of nerve pain. This medicine may be used for other purposes; ask your health care provider or pharmacist if you have questions. COMMON BRAND NAME(S): Active-PAC with Gabapentin, Gabarone, Neurontin What should I tell my health care provider before I take this medicine? They need to know if you have any of these conditions: -kidney disease -suicidal thoughts, plans, or attempt; a previous suicide attempt by you or a family member -an unusual or allergic reaction to gabapentin, other medicines, foods, dyes, or preservatives -pregnant or trying to get pregnant -breast-feeding How should I use this medicine? Take this medicine by mouth with a glass of water. Follow the directions on the prescription label. You can take it with or without food. If it upsets your stomach, take it with food.Take your medicine at regular intervals. Do not take it more often than directed. Do not stop taking except on your doctor's advice. If you are directed to break the 600 or 800 mg tablets in half as part of your dose, the extra half tablet should be used for the next dose. If you have not used the extra half tablet within 28 days, it should be thrown away. A special MedGuide will be given to you by the pharmacist with each prescription and refill. Be sure to read this information carefully each time. Talk to your pediatrician regarding the use of this medicine in children. Special care may be needed. Overdosage: If you think you have taken too much of this medicine contact a poison control center or emergency room at once. NOTE: This medicine is only for you. Do not share this medicine with others. What if I miss a dose? If you miss a dose, take it as soon as you can. If it is almost time for your next dose, take only that dose. Do not  take double or extra doses. What may interact with this medicine? Do not take this medicine with any of the following medications: -other gabapentin products This medicine may also interact with the following medications: -alcohol -antacids -antihistamines for allergy, cough and cold -certain medicines for anxiety or sleep -certain medicines for depression or psychotic disturbances -homatropine; hydrocodone -naproxen -narcotic medicines (opiates) for pain -phenothiazines like chlorpromazine, mesoridazine, prochlorperazine, thioridazine This list may not describe all possible interactions. Give your health care provider a list of all the medicines, herbs, non-prescription drugs, or dietary supplements you use. Also tell them if you smoke, drink alcohol, or use illegal drugs. Some items may interact with your medicine. What should I watch for while using this medicine? Visit your doctor or health care professional for regular checks on your progress. You may want to keep a record at home of how you feel your condition is responding to treatment. You may want to share this information with your doctor or health care professional at each visit. You should contact your doctor or health care professional if your seizures get worse or if you have any new types of seizures. Do not stop taking this medicine or any of your seizure medicines unless instructed by your doctor or health care professional. Stopping your medicine suddenly can increase your seizures or their severity. Wear a medical identification bracelet or chain if you are taking this medicine for seizures, and carry a card that lists all your medications. You may get drowsy, dizzy,   or have blurred vision. Do not drive, use machinery, or do anything that needs mental alertness until you know how this medicine affects you. To reduce dizzy or fainting spells, do not sit or stand up quickly, especially if you are an older patient. Alcohol can  increase drowsiness and dizziness. Avoid alcoholic drinks. Your mouth may get dry. Chewing sugarless gum or sucking hard candy, and drinking plenty of water will help. The use of this medicine may increase the chance of suicidal thoughts or actions. Pay special attention to how you are responding while on this medicine. Any worsening of mood, or thoughts of suicide or dying should be reported to your health care professional right away. Women who become pregnant while using this medicine may enroll in the [place] Antiepileptic Drug Pregnancy Registry by calling 1-888-233-2334. This registry collects information about the safety of antiepileptic drug use during pregnancy. What side effects may I notice from receiving this medicine? Side effects that you should report to your doctor or health care professional as soon as possible: -allergic reactions like skin rash, itching or hives, swelling of the face, lips, or tongue -worsening of mood, thoughts or actions of suicide or dying Side effects that usually do not require medical attention (report to your doctor or health care professional if they continue or are bothersome): -constipation -difficulty walking or controlling muscle movements -dizziness -nausea -slurred speech -tiredness -tremors -weight gain This list may not describe all possible side effects. Call your doctor for medical advice about side effects. You may report side effects to FDA at 1-800-FDA-1088. Where should I keep my medicine? Keep out of reach of children. This medicine may cause accidental overdose and death if it taken by other adults, children, or pets. Mix any unused medicine with a substance like cat litter or coffee grounds. Then throw the medicine away in a sealed container like a sealed bag or a coffee can with a lid. Do not use the medicine after the expiration date. Store at room temperature between 15 and 30 degrees C (59 and 86 degrees F). NOTE: This  sheet is a summary. It may not cover all possible information. If you have questions about this medicine, talk to your doctor, pharmacist, or health care provider.  2018 Elsevier/Gold Standard (2013-09-04 15:26:50)  

## 2018-01-01 NOTE — Progress Notes (Signed)
Subjective: Alexandra Cain is a 72 y.o. is seen today in office s/p right 2nd metatarsal ORIF preformed on 08/28/2017.  She has been doing better.  She is still going to physical therapy she had been wearing a shoe full-time however it does cause some irritation to the ankle and she is concerned that she cannot bend her ankle all the way back.  She has some occasional nerve pain at nighttime on the incision but states localized.  No recent injury or trauma to her feet.  Swelling has been controlled and the color has been normal.  No other concerns today.  Objective: General: No acute distress, AAOx3-presents with her husband. DP/PT pulses palpable 2/4, CRT < 3 sec to all digits.  Protective sensation intact. Motor function intact.  Right foot: Incision is well coapted without any evidence of dehiscence the scar is well formed.  There is no significant tenderness palpation gently on the surgical site on the right foot.  There is no area pinpoint tenderness there is no pain on the fracture site.  There is no other areas of tenderness.  There is no significant discomfort on the sinus tarsi there is no swelling to the area.  Overall there is normal color and temperature to her foot.  Passive range of motion of her ankle she is not able to be completely to neutral but there is no pain at the ankle.  No other areas of tenderness. No other open lesions or pre-ulcerative lesions.  No pain with calf compression, swelling, warmth, erythema.  No signs of DVT.  Assessment and Plan:  Status post right foot 2nd metatarsal ORIF-continuing to improve  -Treatment options discussed including all alternatives, risks, and complications -X-rays were obtained and reviewed with the patient. There is no evidence of acute fracture. Hardware intact. Evidence of consolidation across the fracture site.  -Continue PT. Continue to wear regular shoegear and gradually increase activity level. She has been having some nerve pain.  Discussed gabapentin and will try this. Discussed side affects.  -Continue to ice/elevate.  -Follow-up in 4  weeks or sooner if needed.  Call any questions or concerns.   She wishes to hold off on that at this time.  Trula Slade DPM

## 2018-01-02 ENCOUNTER — Encounter: Payer: Medicare Other | Admitting: Podiatry

## 2018-01-02 DIAGNOSIS — M79671 Pain in right foot: Secondary | ICD-10-CM | POA: Diagnosis not present

## 2018-01-02 DIAGNOSIS — M25671 Stiffness of right ankle, not elsewhere classified: Secondary | ICD-10-CM | POA: Diagnosis not present

## 2018-01-02 DIAGNOSIS — M25674 Stiffness of right foot, not elsewhere classified: Secondary | ICD-10-CM | POA: Diagnosis not present

## 2018-01-02 DIAGNOSIS — M25474 Effusion, right foot: Secondary | ICD-10-CM | POA: Diagnosis not present

## 2018-01-03 ENCOUNTER — Telehealth: Payer: Self-pay | Admitting: Podiatry

## 2018-01-03 NOTE — Telephone Encounter (Signed)
Dr Jacqualyn Posey, do you want to send an Rx for a compounding cream from [place]?

## 2018-01-03 NOTE — Telephone Encounter (Signed)
I saw Dr. Jacqualyn Posey on Tuesday and he prescribed a medicine for me to take at night to help dull the nerve in my foot he did surgery on. I was wondering if he could also prescribe the cream he was talking about just to put on my foot during the day because the compression sock and shoe are so tight on my foot its uncomfortable. If you could call that cream in today that would be great. Dr. Jacqualyn Posey will know what you are talking about because we discussed it on Tuesday. My number is [phone]. Thank you.

## 2018-01-05 NOTE — Telephone Encounter (Signed)
Yes. We can do the anti-inflammatory cream (as long as it has gabapentin) and we can have them add ketamine to it as well. Thanks.

## 2018-01-06 ENCOUNTER — Other Ambulatory Visit: Payer: Self-pay

## 2018-01-06 MED ORDER — NONFORMULARY OR COMPOUNDED ITEM: 120.0000 g | Freq: Four times a day (QID) | 3 refills | Status: DC

## 2018-01-07 DIAGNOSIS — M79671 Pain in right foot: Secondary | ICD-10-CM | POA: Diagnosis not present

## 2018-01-07 DIAGNOSIS — M25671 Stiffness of right ankle, not elsewhere classified: Secondary | ICD-10-CM | POA: Diagnosis not present

## 2018-01-07 DIAGNOSIS — M25474 Effusion, right foot: Secondary | ICD-10-CM | POA: Diagnosis not present

## 2018-01-07 DIAGNOSIS — M25674 Stiffness of right foot, not elsewhere classified: Secondary | ICD-10-CM | POA: Diagnosis not present

## 2018-01-14 DIAGNOSIS — M79671 Pain in right foot: Secondary | ICD-10-CM | POA: Diagnosis not present

## 2018-01-14 DIAGNOSIS — M25671 Stiffness of right ankle, not elsewhere classified: Secondary | ICD-10-CM | POA: Diagnosis not present

## 2018-01-14 DIAGNOSIS — M25674 Stiffness of right foot, not elsewhere classified: Secondary | ICD-10-CM | POA: Diagnosis not present

## 2018-01-14 DIAGNOSIS — M25474 Effusion, right foot: Secondary | ICD-10-CM | POA: Diagnosis not present

## 2018-01-28 ENCOUNTER — Ambulatory Visit (INDEPENDENT_AMBULATORY_CARE_PROVIDER_SITE_OTHER): Payer: Medicare Other | Admitting: Podiatry

## 2018-01-28 DIAGNOSIS — M25674 Stiffness of right foot, not elsewhere classified: Secondary | ICD-10-CM | POA: Diagnosis not present

## 2018-01-28 DIAGNOSIS — M79671 Pain in right foot: Secondary | ICD-10-CM | POA: Diagnosis not present

## 2018-01-28 DIAGNOSIS — S92301G Fracture of unspecified metatarsal bone(s), right foot, subsequent encounter for fracture with delayed healing: Secondary | ICD-10-CM

## 2018-01-28 DIAGNOSIS — M25671 Stiffness of right ankle, not elsewhere classified: Secondary | ICD-10-CM | POA: Diagnosis not present

## 2018-01-28 DIAGNOSIS — Z9889 Other specified postprocedural states: Secondary | ICD-10-CM

## 2018-01-28 DIAGNOSIS — M25474 Effusion, right foot: Secondary | ICD-10-CM | POA: Diagnosis not present

## 2018-01-28 MED ORDER — GABAPENTIN 100 MG PO CAPS: 100.0000 mg | ORAL_CAPSULE | Freq: Every day | ORAL | 0 refills | Status: DC

## 2018-01-29 NOTE — Progress Notes (Signed)
Subjective: Alexandra Cain is a 72 y.o. is seen today in office s/p right 2nd metatarsal ORIF preformed on 08/28/2017.  She states that she is doing "very well".  She is been wearing a regular shoe she is been increasing her activity.  She graduated from physical therapy today.  She states that she does ride her bike for about an hour going 12 miles and she started walking a half a mile.  She still taking gabapentin and this is been helpful for her.  She is asking for refill today.  She denies any increase in swelling.  No recent injury.  She has no other concerns today.  Objective: General: No acute distress, AAOx3-presents with her husband. DP/PT pulses palpable 2/4, CRT < 3 sec to all digits.  Protective sensation intact. Motor function intact.  Right foot: Incision is well coapted without any evidence of dehiscence the scar is well formed.  There is no tenderness palpation of the surgical site or to other areas of the foot or ankle.  Minimal swelling to the area overall the color appears to be normal to her foot.  There is no increase in warmth there is normal temperature gradient as well.  He is still palpate the proximal aspect of the plate however this does not cause any pain or skin irritation at this time.  Range of motion appears to be intact to the ankle.  Subjectively she states that she still has some weakness to her toes. No other areas of tenderness. No other open lesions or pre-ulcerative lesions.  No pain with calf compression, swelling, warmth, erythema.  No signs of DVT.  Assessment and Plan:  Status post right foot 2nd metatarsal ORIF-continuing to improve  -Treatment options discussed including all alternatives, risks, and complications -At this point she is doing well.  She had no pain to hold off on any x-rays today.  Her continue to gradually increase her activity level.  Discussed doing more walking as well as biking.  We discussed trying to wear supportive shoes and she is  unaware sandal needs to wear a tight but has good arch support.  Plan continue gabapentin for her to start to wean off of this.  Refill this for her today.  Return in about 6 weeks (around 03/11/2018).  Trula Slade DPM

## 2018-01-31 ENCOUNTER — Ambulatory Visit: Payer: Medicare Other | Admitting: Podiatry

## 2018-02-07 DIAGNOSIS — L821 Other seborrheic keratosis: Secondary | ICD-10-CM | POA: Diagnosis not present

## 2018-02-07 DIAGNOSIS — D1801 Hemangioma of skin and subcutaneous tissue: Secondary | ICD-10-CM | POA: Diagnosis not present

## 2018-02-07 DIAGNOSIS — L720 Epidermal cyst: Secondary | ICD-10-CM | POA: Diagnosis not present

## 2018-03-07 ENCOUNTER — Other Ambulatory Visit: Payer: Self-pay | Admitting: Obstetrics & Gynecology

## 2018-03-07 DIAGNOSIS — Z1231 Encounter for screening mammogram for malignant neoplasm of breast: Secondary | ICD-10-CM

## 2018-03-11 ENCOUNTER — Ambulatory Visit (INDEPENDENT_AMBULATORY_CARE_PROVIDER_SITE_OTHER): Payer: Medicare Other | Admitting: Podiatry

## 2018-03-11 ENCOUNTER — Ambulatory Visit (INDEPENDENT_AMBULATORY_CARE_PROVIDER_SITE_OTHER): Payer: Medicare Other

## 2018-03-11 DIAGNOSIS — S92301G Fracture of unspecified metatarsal bone(s), right foot, subsequent encounter for fracture with delayed healing: Secondary | ICD-10-CM | POA: Diagnosis not present

## 2018-03-11 DIAGNOSIS — S92301S Fracture of unspecified metatarsal bone(s), right foot, sequela: Secondary | ICD-10-CM | POA: Diagnosis not present

## 2018-03-11 NOTE — Progress Notes (Signed)
Subjective: Alexandra Cain is a 72 y.o. is seen today in office s/p right 2nd metatarsal ORIF preformed on 08/28/2017.  Overall she is getting better.  She is trying to walk a mile a day but she does not feel that she walks with a normal gait pattern on the right side she has difficulty bending her toes.  She does state that she can bend her toes more but is not as normal as the left side.  She is wearing a regular shoe full-time and she is eager to get out of wearing a tennis shoe and she wants to build to wear more dress type shoes.  She has not yet tried this.  She had some swelling on the right side but she has not been using a compression anklet.  She still icing intermittently.  No recent injury or changes.  Objective: General: No acute distress, AAOx3-presents with her husband. DP/PT pulses palpable 2/4, CRT < 3 sec to all digits.  Protective sensation intact. Motor function intact.  Right foot: Incision is well coapted without any evidence of dehiscence the scar is well formed.  There is no significant discomfort to palpation along the incision site.  She states that she gets some occasional nerve pain on the incision but that is been getting better.  There is no other areas of pinpoint tenderness identified and there is very minimal edema there is no erythema or warmth. No pain with calf compression, swelling, warmth, erythema.  No signs of DVT.  Assessment and Plan:  Status post right foot 2nd metatarsal ORIF-continuing to improve  -Treatment options discussed including all alternatives, risks, and complications -X-rays were obtained and reviewed.  Hardware intact for any breakage is no evidence of acute fracture. -Overall she is getting better.  I will gradually increase her activity level and continue with home rehab exercises.  She can start to transition to more other types of shoes but will make sure she is wearing a shoe with good support.  Gradually increase her amount of walking and  activity level.  Trula Slade DPM

## 2018-04-09 ENCOUNTER — Ambulatory Visit
Admission: RE | Admit: 2018-04-09 | Discharge: 2018-04-09 | Disposition: A | Discharge status: Home / Self Care | Payer: Medicare Other | Source: Ambulatory Visit | Attending: Obstetrics & Gynecology | Admitting: Obstetrics & Gynecology

## 2018-04-09 ENCOUNTER — Other Ambulatory Visit: Payer: Self-pay | Admitting: Obstetrics & Gynecology

## 2018-04-09 DIAGNOSIS — Z1231 Encounter for screening mammogram for malignant neoplasm of breast: Secondary | ICD-10-CM | POA: Diagnosis not present

## 2018-04-14 DIAGNOSIS — M85859 Other specified disorders of bone density and structure, unspecified thigh: Secondary | ICD-10-CM | POA: Diagnosis not present

## 2018-04-14 DIAGNOSIS — Z0000 Encounter for general adult medical examination without abnormal findings: Secondary | ICD-10-CM | POA: Diagnosis not present

## 2018-04-14 DIAGNOSIS — R350 Frequency of micturition: Secondary | ICD-10-CM | POA: Diagnosis not present

## 2018-04-14 DIAGNOSIS — F5101 Primary insomnia: Secondary | ICD-10-CM | POA: Diagnosis not present

## 2018-04-14 DIAGNOSIS — Z23 Encounter for immunization: Secondary | ICD-10-CM | POA: Diagnosis not present

## 2018-04-14 DIAGNOSIS — E785 Hyperlipidemia, unspecified: Secondary | ICD-10-CM | POA: Diagnosis not present

## 2018-04-14 DIAGNOSIS — Z79899 Other long term (current) drug therapy: Secondary | ICD-10-CM | POA: Diagnosis not present

## 2018-04-14 DIAGNOSIS — J3089 Other allergic rhinitis: Secondary | ICD-10-CM | POA: Diagnosis not present

## 2018-04-14 DIAGNOSIS — R5383 Other fatigue: Secondary | ICD-10-CM | POA: Diagnosis not present

## 2018-04-14 DIAGNOSIS — Z1389 Encounter for screening for other disorder: Secondary | ICD-10-CM | POA: Diagnosis not present

## 2018-04-22 DIAGNOSIS — Z86010 Personal history of colonic polyps: Secondary | ICD-10-CM | POA: Diagnosis not present

## 2018-04-22 DIAGNOSIS — K219 Gastro-esophageal reflux disease without esophagitis: Secondary | ICD-10-CM | POA: Diagnosis not present

## 2018-04-22 DIAGNOSIS — R1033 Periumbilical pain: Secondary | ICD-10-CM | POA: Diagnosis not present

## 2018-05-08 ENCOUNTER — Encounter: Payer: Self-pay | Admitting: Podiatry

## 2018-05-08 ENCOUNTER — Ambulatory Visit (INDEPENDENT_AMBULATORY_CARE_PROVIDER_SITE_OTHER): Payer: Medicare Other | Admitting: Podiatry

## 2018-05-08 ENCOUNTER — Ambulatory Visit (INDEPENDENT_AMBULATORY_CARE_PROVIDER_SITE_OTHER): Payer: Medicare Other

## 2018-05-08 DIAGNOSIS — S92301S Fracture of unspecified metatarsal bone(s), right foot, sequela: Secondary | ICD-10-CM | POA: Diagnosis not present

## 2018-05-08 DIAGNOSIS — G8918 Other acute postprocedural pain: Secondary | ICD-10-CM

## 2018-05-08 DIAGNOSIS — S92301G Fracture of unspecified metatarsal bone(s), right foot, subsequent encounter for fracture with delayed healing: Secondary | ICD-10-CM

## 2018-05-08 DIAGNOSIS — Z969 Presence of functional implant, unspecified: Secondary | ICD-10-CM | POA: Diagnosis not present

## 2018-05-08 DIAGNOSIS — Z9889 Other specified postprocedural states: Secondary | ICD-10-CM

## 2018-05-09 ENCOUNTER — Other Ambulatory Visit: Payer: Self-pay | Admitting: Podiatry

## 2018-05-09 DIAGNOSIS — Z9889 Other specified postprocedural states: Secondary | ICD-10-CM

## 2018-05-11 NOTE — Progress Notes (Signed)
Subjective: 72 year old female presents the office today for follow-up evaluation of pain to the top of her right foot.  She states that she can feel the hard part of this point.  She is not sure if she removed.  She has been more active and she is been walking 1 to 2 miles and she is been biking about 15 miles on a stationary bike at home.  She denies any recent injury or trauma.  Unfortunately she does notice some slight discoloration of the foot but this is very intermittent.  She denies any recent injury.  She describes more clear symptoms on the incision and along the area the palpable hardware.  Denies any systemic complaints such as fevers, chills, nausea, vomiting. No acute changes since last appointment, and no other complaints at this time.   Objective: AAO x3, NAD DP/PT pulses palpable bilaterally, CRT less than 3 seconds Incision is well-healed from prior surgery.  Hardware is palpable on the proximal incision.  I do not think this is changed greatly however swelling is much improved and she is very minimal swelling so I think she can feel it more.  There is no other area of tenderness and there is no significant tenderness to palpation today on exam. No open lesions or pre-ulcerative lesions.  No pain with calf compression, swelling, warmth, erythema  Assessment: Left foot fracture with evidence of healing however with palpable hardware.  Plan: -All treatment options discussed with the patient including all alternatives, risks, complications.  -X-rays were obtained and reviewed.  Fracture site appears to be healed.  Hardware intact. -At this point I discussed with her hardware removal.  She like to possibly do this in December.  I will see her back in about 1 month and will plan will order CT scan. -Offloading of the palpable hardware.  Continue to gradually increase activity level.  I am very pleased that she is able to be as active as she has been and this is the first that she is  been active in about 16 months. -Patient encouraged to call the office with any questions, concerns, change in symptoms.   Trula Slade DPM

## 2018-05-12 ENCOUNTER — Ambulatory Visit: Payer: Medicare Other | Admitting: Podiatry

## 2018-05-13 ENCOUNTER — Other Ambulatory Visit: Payer: Self-pay | Admitting: Gastroenterology

## 2018-05-13 ENCOUNTER — Ambulatory Visit: Payer: Medicare Other | Admitting: Podiatry

## 2018-05-13 DIAGNOSIS — Z86010 Personal history of colonic polyps: Secondary | ICD-10-CM | POA: Diagnosis not present

## 2018-05-13 DIAGNOSIS — R1033 Periumbilical pain: Secondary | ICD-10-CM

## 2018-05-16 ENCOUNTER — Ambulatory Visit
Admission: RE | Admit: 2018-05-16 | Discharge: 2018-05-16 | Disposition: A | Discharge status: Home / Self Care | Payer: Medicare Other | Source: Ambulatory Visit | Attending: Gastroenterology | Admitting: Gastroenterology

## 2018-05-16 DIAGNOSIS — R1033 Periumbilical pain: Secondary | ICD-10-CM

## 2018-05-16 DIAGNOSIS — R109 Unspecified abdominal pain: Secondary | ICD-10-CM | POA: Diagnosis not present

## 2018-05-16 MED ORDER — IOPAMIDOL (ISOVUE-300) INJECTION 61%
100.0000 mL | Freq: Once | INTRAVENOUS | Status: AC | PRN
  Administered 2018-05-16: 100 mL via INTRAVENOUS

## 2018-05-20 DIAGNOSIS — R110 Nausea: Secondary | ICD-10-CM | POA: Diagnosis not present

## 2018-05-20 DIAGNOSIS — R1013 Epigastric pain: Secondary | ICD-10-CM | POA: Diagnosis not present

## 2018-05-20 DIAGNOSIS — R1033 Periumbilical pain: Secondary | ICD-10-CM | POA: Diagnosis not present

## 2018-05-26 DIAGNOSIS — H6123 Impacted cerumen, bilateral: Secondary | ICD-10-CM | POA: Diagnosis not present

## 2018-05-26 DIAGNOSIS — H903 Sensorineural hearing loss, bilateral: Secondary | ICD-10-CM | POA: Diagnosis not present

## 2018-05-28 ENCOUNTER — Encounter: Payer: Self-pay | Admitting: Obstetrics & Gynecology

## 2018-05-28 DIAGNOSIS — R1013 Epigastric pain: Secondary | ICD-10-CM | POA: Diagnosis not present

## 2018-05-28 DIAGNOSIS — K2950 Unspecified chronic gastritis without bleeding: Secondary | ICD-10-CM | POA: Diagnosis not present

## 2018-05-28 DIAGNOSIS — K319 Disease of stomach and duodenum, unspecified: Secondary | ICD-10-CM | POA: Diagnosis not present

## 2018-05-28 DIAGNOSIS — K449 Diaphragmatic hernia without obstruction or gangrene: Secondary | ICD-10-CM | POA: Diagnosis not present

## 2018-05-28 DIAGNOSIS — K259 Gastric ulcer, unspecified as acute or chronic, without hemorrhage or perforation: Secondary | ICD-10-CM | POA: Diagnosis not present

## 2018-05-28 DIAGNOSIS — K219 Gastro-esophageal reflux disease without esophagitis: Secondary | ICD-10-CM | POA: Diagnosis not present

## 2018-06-05 ENCOUNTER — Ambulatory Visit: Payer: Medicare Other | Admitting: Podiatry

## 2018-06-10 ENCOUNTER — Encounter: Payer: Self-pay | Admitting: Podiatry

## 2018-06-10 ENCOUNTER — Ambulatory Visit (INDEPENDENT_AMBULATORY_CARE_PROVIDER_SITE_OTHER): Payer: Medicare Other | Admitting: Podiatry

## 2018-06-10 DIAGNOSIS — S92301S Fracture of unspecified metatarsal bone(s), right foot, sequela: Secondary | ICD-10-CM | POA: Diagnosis not present

## 2018-06-10 DIAGNOSIS — Z969 Presence of functional implant, unspecified: Secondary | ICD-10-CM

## 2018-06-11 ENCOUNTER — Telehealth: Payer: Self-pay | Admitting: *Deleted

## 2018-06-11 DIAGNOSIS — S92301S Fracture of unspecified metatarsal bone(s), right foot, sequela: Secondary | ICD-10-CM

## 2018-06-11 DIAGNOSIS — Z9889 Other specified postprocedural states: Secondary | ICD-10-CM

## 2018-06-11 DIAGNOSIS — Z969 Presence of functional implant, unspecified: Secondary | ICD-10-CM

## 2018-06-11 NOTE — Telephone Encounter (Signed)
-----   Message from Trula Slade, DPM sent at 06/10/2018  4:03 PM EST ----- Can you please order a CT scan of the left foot to evaluate healing of metatarsal fracture prior to hardware removal.

## 2018-06-11 NOTE — Telephone Encounter (Addendum)
Orders to Gretta Arab, RN for pre-cert, faxed to [facility].

## 2018-06-12 ENCOUNTER — Telehealth: Payer: Self-pay | Admitting: Podiatry

## 2018-06-12 DIAGNOSIS — K259 Gastric ulcer, unspecified as acute or chronic, without hemorrhage or perforation: Secondary | ICD-10-CM | POA: Diagnosis not present

## 2018-06-12 DIAGNOSIS — Z86010 Personal history of colonic polyps: Secondary | ICD-10-CM | POA: Diagnosis not present

## 2018-06-12 DIAGNOSIS — R1013 Epigastric pain: Secondary | ICD-10-CM | POA: Diagnosis not present

## 2018-06-12 DIAGNOSIS — K449 Diaphragmatic hernia without obstruction or gangrene: Secondary | ICD-10-CM | POA: Diagnosis not present

## 2018-06-12 DIAGNOSIS — S92902K Unspecified fracture of left foot, subsequent encounter for fracture with nonunion: Secondary | ICD-10-CM

## 2018-06-12 NOTE — Progress Notes (Addendum)
Subjective: 72 year old female presents the office today for follow-up evaluation of pain to the top of her right foot.  She is interested in having hardware removed next month.  She says she still has some discomfort the top of the foot and she feels that her toes do not move very much and she gets some discomfort in the top of her foot along the area the palpable hardware. Denies any systemic complaints such as fevers, chills, nausea, vomiting. No acute changes since last appointment, and no other complaints at this time.   Objective: AAO x3, NAD DP/PT pulses palpable bilaterally, CRT less than 3 seconds Incision is well-healed from prior surgery.  Hardware is palpable on the proximal incision.  I do not think this is changed greatly however swelling is much improved and she is very minimal swelling so I think she can feel it more.  There is no other area of tenderness and there is no significant tenderness to palpation today on exam. Overall, no changes No open lesions or pre-ulcerative lesions.  No pain with calf compression, swelling, warmth, erythema  Assessment: RIGHT foot fracture with evidence of healing however with palpable hardware.  Plan: -All treatment options discussed with the patient including all alternatives, risks, complications.  -Overall she is about the same.  She was to possibly proceed with hardware removal next month.  Will order CT scan to evaluate the healing of the fracture site prior to removal of hardware.  For now continue with supportive shoes. -Patient encouraged to call the office with any questions, concerns, change in symptoms.   Trula Slade DPM

## 2018-06-12 NOTE — Addendum Note (Signed)
Addended by: Harriett Sine D on: 06/12/2018 04:31 PM   Modules accepted: Orders

## 2018-06-12 NOTE — Telephone Encounter (Signed)
Pt scheduled for CT Scan next week at [facility] does not want to go to [facility] and would like to have it done at [place]. Please give pt a call to see if this is possible.

## 2018-06-12 NOTE — Telephone Encounter (Signed)
I cancelled the [facility] appt.

## 2018-06-12 NOTE — Telephone Encounter (Signed)
I informed pt I had reordered the CT with [facility] [phone]. Faxed orders to [facility].

## 2018-06-17 ENCOUNTER — Ambulatory Visit
Admission: RE | Admit: 2018-06-17 | Discharge: 2018-06-17 | Disposition: A | Discharge status: Home / Self Care | Payer: Medicare Other | Source: Ambulatory Visit | Attending: Podiatry | Admitting: Podiatry

## 2018-06-17 ENCOUNTER — Telehealth: Payer: Self-pay | Admitting: Podiatry

## 2018-06-17 DIAGNOSIS — S92901G Unspecified fracture of right foot, subsequent encounter for fracture with delayed healing: Secondary | ICD-10-CM

## 2018-06-17 DIAGNOSIS — S92902K Unspecified fracture of left foot, subsequent encounter for fracture with nonunion: Secondary | ICD-10-CM

## 2018-06-17 NOTE — Telephone Encounter (Signed)
Faxed changed orders to [facility].

## 2018-06-17 NOTE — Telephone Encounter (Signed)
[facility] called regarding patients CT scan, patient is at their facility now for her ct scan and is stating that it should be done on her right foot. The paperwork we sent over was for the left foot. Can you resubmit this and give them a call please?

## 2018-06-17 NOTE — Addendum Note (Signed)
Addended by: Harriett Sine D on: 06/17/2018 01:29 PM   Modules accepted: Orders

## 2018-06-18 ENCOUNTER — Ambulatory Visit
Admission: RE | Admit: 2018-06-18 | Discharge: 2018-06-18 | Disposition: A | Discharge status: Home / Self Care | Payer: Medicare Other | Source: Ambulatory Visit | Attending: Podiatry | Admitting: Podiatry

## 2018-06-18 ENCOUNTER — Ambulatory Visit (HOSPITAL_COMMUNITY): Payer: Medicare Other

## 2018-06-18 ENCOUNTER — Encounter (HOSPITAL_COMMUNITY): Payer: Self-pay

## 2018-06-18 DIAGNOSIS — S92901G Unspecified fracture of right foot, subsequent encounter for fracture with delayed healing: Secondary | ICD-10-CM | POA: Diagnosis not present

## 2018-06-25 ENCOUNTER — Telehealth: Payer: Self-pay | Admitting: *Deleted

## 2018-06-25 NOTE — Telephone Encounter (Signed)
-----   Message from Trula Slade, DPM sent at 06/24/2018  7:37 AM EST ----- Val- please let her know that the fracture has healed in CT scan and we can proceed with hardware removal as planned. Please have her come in for a pre-op appointment.

## 2018-06-25 NOTE — Telephone Encounter (Signed)
I informed pt of Dr. Leigh Aurora review of results and transferred to schedulers.

## 2018-06-27 ENCOUNTER — Encounter

## 2018-06-27 ENCOUNTER — Ambulatory Visit (INDEPENDENT_AMBULATORY_CARE_PROVIDER_SITE_OTHER): Payer: Medicare Other | Admitting: Podiatry

## 2018-06-27 ENCOUNTER — Encounter: Payer: Self-pay | Admitting: Podiatry

## 2018-06-27 DIAGNOSIS — Z969 Presence of functional implant, unspecified: Secondary | ICD-10-CM

## 2018-06-27 DIAGNOSIS — M79671 Pain in right foot: Secondary | ICD-10-CM

## 2018-06-27 NOTE — Patient Instructions (Signed)

## 2018-06-29 NOTE — Progress Notes (Signed)
Subjective: 72 year old female presents the office today to discuss CT scan results waiting for surgical consultation.  She continues to get some discomfort the top of the foot does have some difficulty bending her toes.  We discussed that this could be coming from the prominent hardware.  Also she states she can feel the screws on the top of her foot.  She denies any recent injury or trauma or any changes since I last saw her. Denies any systemic complaints such as fevers, chills, nausea, vomiting. No acute changes since last appointment, and no other complaints at this time.   Objective: AAO x3, NAD DP/PT pulses palpable bilaterally, CRT less than 3 seconds Scar from the prior surgery is well-healed.  There is no significant edema but the hardware is palpable see the proximal portion of the plate tenting the skin somewhat.  There is no skin breakdown or necrosis.  Decreased range of motion to the MPJs.  No other areas of tenderness elicited at this time. No open lesions or pre-ulcerative lesions.  No pain with calf compression, swelling, warmth, erythema  Assessment: Prominent hardware RIGHT foot  Plan: -All treatment options discussed with the patient including all alternatives, risks, complications.  -CT scan results were discussed with her.  We discussed both conservative as well as surgical treatment options.  At this time given her ongoing symptoms I do recommend hardware removal we discussed this.  Discussed with her the risks of surgery however she wishes to proceed with surgery at this point to remove the prominent hardware. -The incision placement as well as the postoperative course was discussed with the patient. I discussed risks of the surgery which include, but not limited to, infection, bleeding, pain, swelling, need for further surgery, delayed or nonhealing, painful or ugly scar, numbness or sensation changes, over/under correction, recurrence, transfer lesions, further deformity,  hardware failure, fracture, DVT/PE, loss of toe/foot. Patient understands these risks and wishes to proceed with surgery. The surgical consent was reviewed with the patient all 3 pages were signed. No promises or guarantees were given to the outcome of the procedure. All questions were answered to the best of my ability. Before the surgery the patient was encouraged to call the office if there is any further questions. The surgery will be performed at the [facility] on an outpatient basis. -Patient encouraged to call the office with any questions, concerns, change in symptoms.   Trula Slade DPM

## 2018-06-30 ENCOUNTER — Ambulatory Visit: Payer: Medicare Other | Admitting: Podiatry

## 2018-07-03 ENCOUNTER — Telehealth: Payer: Self-pay | Admitting: *Deleted

## 2018-07-03 ENCOUNTER — Telehealth: Payer: Self-pay | Admitting: Podiatry

## 2018-07-03 ENCOUNTER — Other Ambulatory Visit: Payer: Self-pay | Admitting: Podiatry

## 2018-07-03 DIAGNOSIS — Z01818 Encounter for other preprocedural examination: Secondary | ICD-10-CM

## 2018-07-03 DIAGNOSIS — E559 Vitamin D deficiency, unspecified: Secondary | ICD-10-CM

## 2018-07-03 NOTE — Telephone Encounter (Signed)
Reordered the labs Dr. Jacqualyn Posey ordered not as future.

## 2018-07-03 NOTE — Telephone Encounter (Signed)
I called and spoke with Alexandra Cain. We reviewed the planned surgery as well as a CT scan results.  After discussion she states that she feels more comfortable with the surgery.  We discussed that surgery because postoperative course.  She feels better by the surgeon is working on plan as scheduled.  I did order a vitamin D level, CBC and BMP for preoperative evaluation.  I gave her the information on the location the phone number for [facility] on [address]

## 2018-07-03 NOTE — Telephone Encounter (Signed)
I'm scheduled for surgery with Dr. Jacqualyn Posey 18 December. What time is it so I can arrange transportation? I explained the surgery center would call 24-48 hours before and give her a time as it depends on their schedules and if there are any kids or pt's with diabetes that would need to go first. Pt stated if she decided to cancel surgery, how much time notice did she need to give. I said at least 24 hours but more would be better. Pt is not sure she wants to go through with the surgery because she feels like it may not be a good outcome. I offered to schedule her an appointment with Dr. Jacqualyn Posey to discuss it further before the 88 th and she declined stating he answered all her questions, just it didn't sound rosy. I told her I would send a message to both him and the nurse and one of them would call her back to talk to her.

## 2018-07-07 ENCOUNTER — Telehealth: Payer: Self-pay | Admitting: *Deleted

## 2018-07-07 ENCOUNTER — Telehealth: Payer: Self-pay | Admitting: Podiatry

## 2018-07-07 NOTE — Telephone Encounter (Signed)
Needs a Lab Requisition [phone] Fax Number [email or web address]

## 2018-07-07 NOTE — Telephone Encounter (Signed)
[facility] states pt's vitamin D was not drawn on Friday and they need a requistion. Orders faxed.

## 2018-07-07 NOTE — Telephone Encounter (Signed)
"  I called and left a message this morning.  I just wanted to make sure you got the message that I want to cancel my surgery for this Wednesday."  Is there a particular reason?  "I just want to see if it will heal on it's own.  I'll call later to reschedule it if I need to."

## 2018-07-08 ENCOUNTER — Telehealth: Payer: Self-pay | Admitting: *Deleted

## 2018-07-08 NOTE — Telephone Encounter (Signed)
Postop appointments have been canceled.  °

## 2018-07-08 NOTE — Telephone Encounter (Signed)
"  Dr. Jacqualyn Posey had me scheduled for surgery Wednesday morning for my foot.  I'm calling and giving you notice that I am not going to have the surgery.  He said I could put it off a little longer.  I'm going to do that and see if it gets any better on its own.  I am not having surgery Wednesday morning."

## 2018-07-14 ENCOUNTER — Encounter: Payer: Medicare Other | Admitting: Podiatry

## 2018-07-18 ENCOUNTER — Other Ambulatory Visit: Payer: Medicare Other

## 2018-07-18 ENCOUNTER — Other Ambulatory Visit: Payer: Self-pay | Admitting: Obstetrics & Gynecology

## 2018-07-18 NOTE — Telephone Encounter (Signed)
Medication refill request: Estradiol Last AEX:  06/18/17 SM Next AEX: 08/29/18 Last MMG (if hormonal medication request): 04/09/18 BIRADS 1 negative/density b Refill authorized: 12/03/17 #90 w/2 refills; today please advise  Medication refill request: Provera 2.5mg  Refill authorized: 12/03/17 #90 w/2 refills; today please advise

## 2018-08-05 DIAGNOSIS — R1013 Epigastric pain: Secondary | ICD-10-CM | POA: Diagnosis not present

## 2018-08-05 DIAGNOSIS — R1033 Periumbilical pain: Secondary | ICD-10-CM | POA: Diagnosis not present

## 2018-08-05 DIAGNOSIS — K219 Gastro-esophageal reflux disease without esophagitis: Secondary | ICD-10-CM | POA: Diagnosis not present

## 2018-08-05 DIAGNOSIS — Z86010 Personal history of colonic polyps: Secondary | ICD-10-CM | POA: Diagnosis not present

## 2018-08-22 DIAGNOSIS — R1084 Generalized abdominal pain: Secondary | ICD-10-CM | POA: Diagnosis not present

## 2018-08-22 DIAGNOSIS — R140 Abdominal distension (gaseous): Secondary | ICD-10-CM | POA: Diagnosis not present

## 2018-08-22 DIAGNOSIS — K253 Acute gastric ulcer without hemorrhage or perforation: Secondary | ICD-10-CM | POA: Diagnosis not present

## 2018-08-22 DIAGNOSIS — Z86010 Personal history of colonic polyps: Secondary | ICD-10-CM | POA: Diagnosis not present

## 2018-08-22 DIAGNOSIS — R110 Nausea: Secondary | ICD-10-CM | POA: Diagnosis not present

## 2018-08-26 ENCOUNTER — Encounter: Payer: Self-pay | Admitting: Obstetrics & Gynecology

## 2018-08-26 DIAGNOSIS — K222 Esophageal obstruction: Secondary | ICD-10-CM | POA: Diagnosis not present

## 2018-08-26 DIAGNOSIS — Z86010 Personal history of colonic polyps: Secondary | ICD-10-CM | POA: Diagnosis not present

## 2018-08-26 DIAGNOSIS — K2930 Chronic superficial gastritis without bleeding: Secondary | ICD-10-CM | POA: Diagnosis not present

## 2018-08-26 DIAGNOSIS — K219 Gastro-esophageal reflux disease without esophagitis: Secondary | ICD-10-CM | POA: Diagnosis not present

## 2018-08-26 DIAGNOSIS — R1013 Epigastric pain: Secondary | ICD-10-CM | POA: Diagnosis not present

## 2018-08-29 ENCOUNTER — Encounter

## 2018-08-29 ENCOUNTER — Ambulatory Visit (INDEPENDENT_AMBULATORY_CARE_PROVIDER_SITE_OTHER): Payer: Medicare Other | Admitting: Obstetrics & Gynecology

## 2018-08-29 ENCOUNTER — Encounter: Payer: Self-pay | Admitting: Obstetrics & Gynecology

## 2018-08-29 VITALS — BP 110/70 | HR 76 | Resp 16 | Ht 62.25 in | Wt 124.2 lb

## 2018-08-29 DIAGNOSIS — Z01419 Encounter for gynecological examination (general) (routine) without abnormal findings: Secondary | ICD-10-CM

## 2018-08-29 DIAGNOSIS — Z124 Encounter for screening for malignant neoplasm of cervix: Secondary | ICD-10-CM | POA: Diagnosis not present

## 2018-08-29 DIAGNOSIS — R1909 Other intra-abdominal and pelvic swelling, mass and lump: Secondary | ICD-10-CM

## 2018-08-29 DIAGNOSIS — R140 Abdominal distension (gaseous): Secondary | ICD-10-CM

## 2018-08-29 MED ORDER — MEDROXYPROGESTERONE ACETATE 2.5 MG PO TABS: 2.5000 mg | ORAL_TABLET | Freq: Every day | ORAL | 4 refills | Status: DC

## 2018-08-29 MED ORDER — ESTRADIOL 0.5 MG PO TABS: 0.5000 mg | ORAL_TABLET | Freq: Every day | ORAL | 4 refills | Status: DC

## 2018-08-29 NOTE — Progress Notes (Signed)
73 y.o. G0P0 Married White or Caucasian female here for annual exam.  Doing well but had to have foot surgery a year ago.  Has a plate and screws.  She does feel a screw and this bothersome to her.  Dr. Jacqualyn Posey may end up having to take the screw out.  Hasn't been decided yet.    Denies vagina bleeding.  Had abdominal pain issues this past year.  CT scan was negative.  Dr. Collene Mares put her on several medications and this did not help.  Saw Dr. Cristina Gong for a second opinion.  Had endoscopy and colonoscopy.  Several biopsies were done.  Ultrasound was recommended.    D/w pt HRT dosage.  Willing to try and cut these in half to see how she does with these.    PCP:  Dr. Clayton Bibles at [facility] [place].  Last appt was in September.  Blood work was done then.    Patient's last menstrual period was 07/24/1991.          Sexually active: Yes.    The current method of family planning is post menopausal status.    Exercising: Yes.    bike walk Smoker:  no  Health Maintenance: Pap:  06/21/17 Neg   12/06/14 Neg  History of abnormal Pap:  no MMG:  04/09/18 BIRADS1:neg  Colonoscopy:  08/25/18 normal.  BMD:   11/26/16 Osteopenia  TDaP:  2016 Pneumonia vaccine(s):  2012 Shingrix:   Completed  Hep C testing: 04/30/16 neg  Screening Labs: CA125   reports that she has never smoked. She has never used smokeless tobacco. She reports that she does not drink alcohol or use drugs.  Past Medical History:  Diagnosis Date  . Endometriosis 3/96   bx neg, suspicious for endo  . Frozen shoulder 2010  . Infertility, female   . Menopause   . Ulcer     Past Surgical History:  Procedure Laterality Date  . APPENDECTOMY    . AUGMENTATION MAMMAPLASTY  1975  . CYST EXCISION Left 04/2015   ear   . CYST EXCISION Left 09/2015   Shoulder  . DILATION AND CURETTAGE OF UTERUS  1993  . EYE SURGERY  11/02/14  . OTHER SURGICAL HISTORY  3/07   excision fo cx granuloma tissue under anesthesia  . PELVIC LAPAROSCOPY  3/96   due to  pelvic pain  . TONSILLECTOMY AND ADENOIDECTOMY      Current Outpatient Medications  Medication Sig Dispense Refill  . cholecalciferol (VITAMIN D) 1000 UNITS tablet Take 1,000 Units by mouth daily.    . Coenzyme Q10 (CO Q 10) 100 MG CAPS Take by mouth daily.    Marland Kitchen estradiol (ESTRACE) 0.5 MG tablet TAKE 1 TABLET BY MOUTH  DAILY 90 tablet 1  . fluticasone (FLONASE) 50 MCG/ACT nasal spray daily.    . medroxyPROGESTERone (PROVERA) 2.5 MG tablet TAKE 1 TABLET BY MOUTH  DAILY 90 tablet 1  . Multiple Vitamin (MULTIVITAMIN) tablet Take 1 tablet by mouth daily.    . rosuvastatin (CRESTOR) 5 MG tablet Take 1 tablet by mouth daily.     No current facility-administered medications for this visit.     Family History  Problem Relation Age of Onset  . Dementia Mother   . Breast cancer Paternal Grandmother     Review of Systems  Constitutional:       Weight gain   Genitourinary: Positive for urgency.       Loss of urine with sneeze or cough   All  other systems reviewed and are negative.   Exam:   BP 110/70 (BP Location: Right Arm, Patient Position: Sitting, Cuff Size: Normal)   Pulse 76   Resp 16   Ht 5' 2.25" (1.581 m)   Wt 124 lb 3.2 oz (56.3 kg)   LMP 07/24/1991   BMI 22.53 kg/m   Height: 5' 2.25" (158.1 cm)  Ht Readings from Last 3 Encounters:  08/29/18 5' 2.25" (1.581 m)  04/30/16 5' 2.5" (1.588 m)  12/06/14 5' 2.5" (1.588 m)    General appearance: alert, cooperative and appears stated age Head: Normocephalic, without obvious abnormality, atraumatic Neck: no adenopathy, supple, symmetrical, trachea midline and thyroid normal to inspection and palpation Lungs: clear to auscultation bilaterally Breasts: normal appearance, no masses or tenderness Heart: regular rate and rhythm Abdomen: soft, non-tender; bowel sounds normal; no masses,  no organomegaly Extremities: extremities normal, atraumatic, no cyanosis or edema Skin: Skin color, texture, turgor normal. No rashes or  lesions Lymph nodes: Cervical, supraclavicular, and axillary nodes normal. No abnormal inguinal nodes palpated Neurologic: Grossly normal   Pelvic: External genitalia:  no lesions              Urethra:  normal appearing urethra with no masses, tenderness or lesions              Bartholins and Skenes: normal                 Vagina: apex is scarred and narrow, this is unchanged              Cervix: difficult to visualize              Pap taken: No. Bimanual Exam:  Uterus:  normal size, contour, position, consistency, mobility, non-tender              Adnexa: normal adnexa and no mass, fullness, tenderness               Rectovaginal: Confirms               Anus:  normal sphincter tone, no lesions  Chaperone was present for exam.  A:  Well Woman with normal exam PMP, no HRT Grade 3 breast density with bilateral breast implants Chronic RLQ pain, abdominal bloating.  Did see Dr. Cristina Gong.  P:   Mammogram guidelines reviewed. pap smear neg 2019 Lab work done with Dr. Clayton Bibles Ca 125 pending.  She desire this yearly. RF for estradiol 0.5mg  daily and Provera 2.5mg  daily . She is going to cut these in 1/2.  #90/4RF.  Aware of risks and desires to continue. Plan to return for PUS Return annually or prn

## 2018-08-30 LAB — CA 125: Cancer Antigen (CA) 125: 18.8 U/mL (ref 0.0–38.1)

## 2018-09-02 DIAGNOSIS — K2960 Other gastritis without bleeding: Secondary | ICD-10-CM | POA: Diagnosis not present

## 2018-09-02 DIAGNOSIS — R1084 Generalized abdominal pain: Secondary | ICD-10-CM | POA: Diagnosis not present

## 2018-09-02 DIAGNOSIS — R1013 Epigastric pain: Secondary | ICD-10-CM | POA: Diagnosis not present

## 2018-09-02 DIAGNOSIS — R110 Nausea: Secondary | ICD-10-CM | POA: Diagnosis not present

## 2018-09-03 ENCOUNTER — Other Ambulatory Visit: Payer: Self-pay | Admitting: Gastroenterology

## 2018-09-03 DIAGNOSIS — K2930 Chronic superficial gastritis without bleeding: Secondary | ICD-10-CM | POA: Diagnosis not present

## 2018-09-03 DIAGNOSIS — K219 Gastro-esophageal reflux disease without esophagitis: Secondary | ICD-10-CM | POA: Diagnosis not present

## 2018-09-03 DIAGNOSIS — R1084 Generalized abdominal pain: Secondary | ICD-10-CM

## 2018-09-04 ENCOUNTER — Ambulatory Visit (INDEPENDENT_AMBULATORY_CARE_PROVIDER_SITE_OTHER): Payer: Medicare Other | Admitting: Obstetrics & Gynecology

## 2018-09-04 ENCOUNTER — Encounter: Payer: Self-pay | Admitting: Obstetrics & Gynecology

## 2018-09-04 ENCOUNTER — Ambulatory Visit (INDEPENDENT_AMBULATORY_CARE_PROVIDER_SITE_OTHER): Payer: Medicare Other

## 2018-09-04 VITALS — BP 120/70 | HR 68 | Resp 16 | Ht 62.25 in | Wt 123.0 lb

## 2018-09-04 DIAGNOSIS — R140 Abdominal distension (gaseous): Secondary | ICD-10-CM

## 2018-09-04 DIAGNOSIS — D251 Intramural leiomyoma of uterus: Secondary | ICD-10-CM | POA: Diagnosis not present

## 2018-09-04 DIAGNOSIS — R1031 Right lower quadrant pain: Secondary | ICD-10-CM | POA: Diagnosis not present

## 2018-09-04 DIAGNOSIS — N83202 Unspecified ovarian cyst, left side: Secondary | ICD-10-CM

## 2018-09-04 NOTE — Progress Notes (Signed)
73 y.o. G0P0 Married White or Caucasian female here for pelvic ultrasound due to abdominal bloating and pain has been much worse this past year.  She has recently seen Dr. Cristina Gong and had and endoscopy and colonoscopy.  This showed increased bile in her stomach.  She had biopsies that she just learned were negative.  She does have a gall bladder ultrasound scheduled for tomorrow morning and he thinks her symptoms are all gall bladder related.  Patient's last menstrual period was 07/24/1991.  Contraception: PMP  Findings:  UTERUS: 5.8 x 3.4 x 3.0cm with two small fibroids 1cm and less EMS: 3.41mm ADNEXA: Left ovary: 2.0 x 1.9 x 1.9cm with avascular 1.5 x 1.2cm cyst with low level echoes       Right ovary: 1.0 x 1.0 x 0.45mm CUL DE SAC: no free fluid  Discussion:  Cervical polyp possible seen.  This is very small and pt does not have any vaginal bleeding.  Ovarian cyst noted today was also noted on CT in 11/19.  Prior CT in 2012 has no comment about this cyst.  Pt did have an ultrasound years ago in our old office and those pictures should be in her paper chart.  Will request this.  If cyst was present, no additional follow up needed as pt does have hx of endometriosis as well.  If cyst is not present, will plan to repeat PUS in six months.  She is very comfortabled with this plan..  Assessment:  Abdominal pain and bloating Small fibroids Possible cervical polyp 1.5cm left ovarian cyst  Plan:  Will obtain paper chart and compare if cyst was present.  If not, will proceed with repeat PUS in 6 months  ~15 minutes spent with patient >50% of time was in face to face discussion of above.

## 2018-09-05 ENCOUNTER — Ambulatory Visit
Admission: RE | Admit: 2018-09-05 | Discharge: 2018-09-05 | Disposition: A | Discharge status: Home / Self Care | Payer: Medicare Other | Source: Ambulatory Visit | Attending: Gastroenterology | Admitting: Gastroenterology

## 2018-09-05 DIAGNOSIS — R1084 Generalized abdominal pain: Secondary | ICD-10-CM

## 2018-09-05 DIAGNOSIS — K7689 Other specified diseases of liver: Secondary | ICD-10-CM | POA: Diagnosis not present

## 2018-09-09 ENCOUNTER — Other Ambulatory Visit: Payer: Self-pay | Admitting: Gastroenterology

## 2018-09-09 DIAGNOSIS — R1084 Generalized abdominal pain: Secondary | ICD-10-CM

## 2018-09-12 ENCOUNTER — Encounter (HOSPITAL_COMMUNITY)
Admission: RE | Admit: 2018-09-12 | Discharge: 2018-09-12 | Disposition: A | Discharge status: Home / Self Care | Payer: Medicare Other | Source: Ambulatory Visit | Attending: Gastroenterology | Admitting: Gastroenterology

## 2018-09-12 DIAGNOSIS — R1084 Generalized abdominal pain: Secondary | ICD-10-CM | POA: Diagnosis not present

## 2018-09-12 DIAGNOSIS — R109 Unspecified abdominal pain: Secondary | ICD-10-CM | POA: Diagnosis not present

## 2018-09-12 DIAGNOSIS — R110 Nausea: Secondary | ICD-10-CM | POA: Diagnosis not present

## 2018-09-12 MED ORDER — TECHNETIUM TC 99M MEBROFENIN IV KIT
5.0000 | PACK | Freq: Once | INTRAVENOUS | Status: AC | PRN
  Administered 2018-09-12: 5 via INTRAVENOUS

## 2018-09-17 DIAGNOSIS — R110 Nausea: Secondary | ICD-10-CM | POA: Diagnosis not present

## 2018-12-03 DIAGNOSIS — R110 Nausea: Secondary | ICD-10-CM | POA: Diagnosis not present

## 2018-12-03 DIAGNOSIS — R140 Abdominal distension (gaseous): Secondary | ICD-10-CM | POA: Diagnosis not present

## 2018-12-10 DIAGNOSIS — R110 Nausea: Secondary | ICD-10-CM | POA: Diagnosis not present

## 2018-12-29 DIAGNOSIS — H353131 Nonexudative age-related macular degeneration, bilateral, early dry stage: Secondary | ICD-10-CM | POA: Diagnosis not present

## 2018-12-29 DIAGNOSIS — H35372 Puckering of macula, left eye: Secondary | ICD-10-CM | POA: Diagnosis not present

## 2018-12-29 DIAGNOSIS — H524 Presbyopia: Secondary | ICD-10-CM | POA: Diagnosis not present

## 2018-12-29 DIAGNOSIS — H2513 Age-related nuclear cataract, bilateral: Secondary | ICD-10-CM | POA: Diagnosis not present

## 2019-01-08 DIAGNOSIS — H2511 Age-related nuclear cataract, right eye: Secondary | ICD-10-CM | POA: Diagnosis not present

## 2019-01-08 DIAGNOSIS — H25811 Combined forms of age-related cataract, right eye: Secondary | ICD-10-CM | POA: Diagnosis not present

## 2019-01-30 ENCOUNTER — Other Ambulatory Visit: Payer: Self-pay | Admitting: Podiatry

## 2019-01-30 ENCOUNTER — Encounter: Payer: Self-pay | Admitting: Podiatry

## 2019-01-30 ENCOUNTER — Other Ambulatory Visit: Payer: Self-pay

## 2019-01-30 ENCOUNTER — Ambulatory Visit (INDEPENDENT_AMBULATORY_CARE_PROVIDER_SITE_OTHER): Payer: Medicare Other | Admitting: Podiatry

## 2019-01-30 ENCOUNTER — Ambulatory Visit (INDEPENDENT_AMBULATORY_CARE_PROVIDER_SITE_OTHER): Payer: Medicare Other

## 2019-01-30 VITALS — Temp 97.3°F

## 2019-01-30 DIAGNOSIS — M778 Other enthesopathies, not elsewhere classified: Secondary | ICD-10-CM

## 2019-01-30 DIAGNOSIS — M79671 Pain in right foot: Secondary | ICD-10-CM

## 2019-01-30 DIAGNOSIS — M7751 Other enthesopathy of right foot: Secondary | ICD-10-CM

## 2019-01-30 DIAGNOSIS — Z472 Encounter for removal of internal fixation device: Secondary | ICD-10-CM

## 2019-01-30 DIAGNOSIS — M779 Enthesopathy, unspecified: Secondary | ICD-10-CM

## 2019-02-03 DIAGNOSIS — M7541 Impingement syndrome of right shoulder: Secondary | ICD-10-CM | POA: Diagnosis not present

## 2019-02-05 NOTE — Progress Notes (Signed)
Subjective:   Patient ID: Alexandra Cain, female   DOB: 73 y.o.   MRN: [id number]   HPI Patient presents stating I developed pain on top of my foot and I had surgery by Dr. Carman Ching February 2019.  Patient states that she walks 2 miles a day and that this is become recently aggravating for her.  She also has numerous questions concerning removal of fixation that Dr. Carman Ching had discussed with her   ROS      Objective:  Physical Exam  Neurovascular status was found to be intact with patient's forefoot showing incision over the midfoot where there was Lisfranc fusion.  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  At this point I advised that she discuss with Dr. Carman Ching the consideration for hardware removal and today I am focusing on the inflamed joint.  I did a proximal nerve block 60 mg like Marcaine mixture sterile prep applied and using sterile instrumentation I aspirated the third MPJ getting a small amount of clear fluid and injected with quarter cc dexamethasone Kenalog and applied thick pad to reduce pressure against the joint surface.  Patient will be seen back to recheck again by Dr. Carman Ching 2 weeks  X-ray indicates that there is a plate on the second metatarsal cuneiform joint right where the previous fusion was done there is no indication of forefoot arthritis or stress fracture signed visit

## 2019-02-11 DIAGNOSIS — L821 Other seborrheic keratosis: Secondary | ICD-10-CM | POA: Diagnosis not present

## 2019-02-11 DIAGNOSIS — L720 Epidermal cyst: Secondary | ICD-10-CM | POA: Diagnosis not present

## 2019-02-11 DIAGNOSIS — D1801 Hemangioma of skin and subcutaneous tissue: Secondary | ICD-10-CM | POA: Diagnosis not present

## 2019-02-12 DIAGNOSIS — H25812 Combined forms of age-related cataract, left eye: Secondary | ICD-10-CM | POA: Diagnosis not present

## 2019-02-12 DIAGNOSIS — H2512 Age-related nuclear cataract, left eye: Secondary | ICD-10-CM | POA: Diagnosis not present

## 2019-02-13 ENCOUNTER — Other Ambulatory Visit: Payer: Self-pay

## 2019-02-13 ENCOUNTER — Telehealth: Payer: Self-pay | Admitting: *Deleted

## 2019-02-13 ENCOUNTER — Ambulatory Visit (INDEPENDENT_AMBULATORY_CARE_PROVIDER_SITE_OTHER): Payer: Medicare Other | Admitting: Podiatry

## 2019-02-13 ENCOUNTER — Encounter: Payer: Self-pay | Admitting: Podiatry

## 2019-02-13 VITALS — Temp 98.0°F

## 2019-02-13 DIAGNOSIS — G8929 Other chronic pain: Secondary | ICD-10-CM | POA: Diagnosis not present

## 2019-02-13 DIAGNOSIS — M779 Enthesopathy, unspecified: Secondary | ICD-10-CM | POA: Diagnosis not present

## 2019-02-13 DIAGNOSIS — Z969 Presence of functional implant, unspecified: Secondary | ICD-10-CM

## 2019-02-13 DIAGNOSIS — M79671 Pain in right foot: Secondary | ICD-10-CM

## 2019-02-13 NOTE — Telephone Encounter (Signed)
Sent Dr Jacqualyn Posey a message. Alexandra Cain

## 2019-02-13 NOTE — Telephone Encounter (Signed)
Patient saw Dr. Jacqualyn Posey this morning and is requesting that her Orthotic impressions be redone by Ric since insurance is covering.  She wants to make sure that they are done correctly.

## 2019-02-16 ENCOUNTER — Other Ambulatory Visit: Payer: Self-pay

## 2019-02-16 ENCOUNTER — Telehealth: Payer: Self-pay | Admitting: *Deleted

## 2019-02-16 ENCOUNTER — Ambulatory Visit: Payer: Self-pay | Admitting: Orthotics

## 2019-02-16 DIAGNOSIS — G8929 Other chronic pain: Secondary | ICD-10-CM

## 2019-02-16 DIAGNOSIS — M779 Enthesopathy, unspecified: Secondary | ICD-10-CM

## 2019-02-16 DIAGNOSIS — M79671 Pain in right foot: Secondary | ICD-10-CM

## 2019-02-16 NOTE — Telephone Encounter (Signed)
Pt called states she is having a problem with the orthotics made by Dr. Leigh Aurora assistant.

## 2019-02-19 NOTE — Progress Notes (Signed)
Subjective: 73 year old female presents the office today for concerns of pain to her right foot.  She states that she was having some pain to the ball of her right foot and she followed up with Dr. Paulla Dolly and she had an injection performed which is been helpful.  Still has some bruising to the area.  She states in general she gets discomfort to her foot she cannot move her second third toes well.  No recent injury.  No significant swelling.  She states it still sensitive around the area of the plate. Denies any systemic complaints such as fevers, chills, nausea, vomiting. No acute changes since last appointment, and no other complaints at this time.   Objective: AAO x3, NAD DP/PT pulses palpable bilaterally, CRT less than 3 seconds On exam there is no area pinpoint tenderness.  She describes general achiness to her foot.  There is no pain to MPJs today.  Hardware is palpable.  Minimal edema there is no erythema or warmth.  Normal temperature.  No open lesions or pre-ulcerative lesions.  No pain with calf compression, swelling, warmth, erythema  Assessment: Right chronic foot pain, capsulitis department with  Plan: -All treatment options discussed with the patient including all alternatives, risks, complications.  -We had a long discussion regards to treatment options both conservative as well as surgical.  Continue that eventually will be helpful for her.  However I do think to try to get her into an orthotic to help offload the foot MTPJ's will be helpful as well even with the surgery remove the hardware.  Restarted the inserts and shoe changes first.  She is in agreement with this. -Patient encouraged to call the office with any questions, concerns, change in symptoms.   Trula Slade DPM

## 2019-02-27 DIAGNOSIS — H9201 Otalgia, right ear: Secondary | ICD-10-CM | POA: Diagnosis not present

## 2019-02-27 DIAGNOSIS — H6981 Other specified disorders of Eustachian tube, right ear: Secondary | ICD-10-CM | POA: Diagnosis not present

## 2019-03-03 NOTE — Progress Notes (Signed)
Rescanned for f/o as she wasn't comfortable with previous cast in foam box.

## 2019-03-05 ENCOUNTER — Ambulatory Visit: Payer: Medicare Other | Admitting: Orthotics

## 2019-03-05 ENCOUNTER — Other Ambulatory Visit: Payer: Self-pay

## 2019-03-05 DIAGNOSIS — M779 Enthesopathy, unspecified: Secondary | ICD-10-CM

## 2019-03-05 DIAGNOSIS — Z969 Presence of functional implant, unspecified: Secondary | ICD-10-CM

## 2019-03-05 DIAGNOSIS — M79671 Pain in right foot: Secondary | ICD-10-CM

## 2019-03-05 DIAGNOSIS — G8929 Other chronic pain: Secondary | ICD-10-CM

## 2019-03-05 NOTE — Progress Notes (Signed)
Patient came in today to pick up custom made foot orthotics.  The goals were accomplished and the patient reported no dissatisfaction with said orthotics.  Patient was advised of breakin period and how to report any issues.  She will follow up with me in two weeks to determine if she can toleraste offload R

## 2019-03-10 DIAGNOSIS — M7541 Impingement syndrome of right shoulder: Secondary | ICD-10-CM | POA: Diagnosis not present

## 2019-03-13 DIAGNOSIS — M25511 Pain in right shoulder: Secondary | ICD-10-CM | POA: Diagnosis not present

## 2019-03-17 ENCOUNTER — Ambulatory Visit (INDEPENDENT_AMBULATORY_CARE_PROVIDER_SITE_OTHER): Payer: Medicare Other

## 2019-03-17 ENCOUNTER — Ambulatory Visit (INDEPENDENT_AMBULATORY_CARE_PROVIDER_SITE_OTHER): Payer: Medicare Other | Admitting: Podiatry

## 2019-03-17 ENCOUNTER — Ambulatory Visit: Payer: Medicare Other | Admitting: Orthotics

## 2019-03-17 ENCOUNTER — Other Ambulatory Visit: Payer: Self-pay

## 2019-03-17 ENCOUNTER — Encounter: Payer: Self-pay | Admitting: Podiatry

## 2019-03-17 VITALS — Temp 97.4°F

## 2019-03-17 DIAGNOSIS — G8929 Other chronic pain: Secondary | ICD-10-CM

## 2019-03-17 DIAGNOSIS — M79671 Pain in right foot: Secondary | ICD-10-CM

## 2019-03-17 DIAGNOSIS — Z969 Presence of functional implant, unspecified: Secondary | ICD-10-CM | POA: Diagnosis not present

## 2019-03-17 DIAGNOSIS — M779 Enthesopathy, unspecified: Secondary | ICD-10-CM

## 2019-03-17 NOTE — Progress Notes (Signed)
Will adjust f/o to 3/4 length after sx.

## 2019-03-17 NOTE — Patient Instructions (Signed)
Pre-Operative Instructions  Congratulations, you have decided to take an important step towards improving your quality of life.  You can be assured that the doctors and staff at [facility] will be with you every step of the way.  Here are some important things you should know:  1. Plan to be at the surgery center/hospital at least 1 (one) hour prior to your scheduled time, unless otherwise directed by the surgical center/hospital staff.  You must have a responsible adult accompany you, remain during the surgery and drive you home.  Make sure you have directions to the surgical center/hospital to ensure you arrive on time. 2. If you are having surgery at [facility] or [facility], you will need a copy of your medical history and physical form from your family physician within one month prior to the date of surgery. We will give you a form for your primary physician to complete.  3. We make every effort to accommodate the date you request for surgery.  However, there are times where surgery dates or times have to be moved.  We will contact you as soon as possible if a change in schedule is required.   4. No aspirin/ibuprofen for one week before surgery.  If you are on aspirin, any non-steroidal anti-inflammatory medications (Mobic, Aleve, Ibuprofen) should not be taken seven (7) days prior to your surgery.  You make take Tylenol for pain prior to surgery.  5. Medications - If you are taking daily heart and blood pressure medications, seizure, reflux, allergy, asthma, anxiety, pain or diabetes medications, make sure you notify the surgery center/hospital before the day of surgery so they can tell you which medications you should take or avoid the day of surgery. 6. No food or drink after midnight the night before surgery unless directed otherwise by surgical center/hospital staff. 7. No alcoholic beverages 24-hours prior to surgery.  No smoking 24-hours prior or 24-hours after  surgery. 8. Wear loose pants or shorts. They should be loose enough to fit over bandages, boots, and casts. 9. Don't wear slip-on shoes. Sneakers are preferred. 10. Bring your boot with you to the surgery center/hospital.  Also bring crutches or a walker if your physician has prescribed it for you.  If you do not have this equipment, it will be provided for you after surgery. 11. If you have not been contacted by the surgery center/hospital by the day before your surgery, call to confirm the date and time of your surgery. 12. Leave-time from work may vary depending on the type of surgery you have.  Appropriate arrangements should be made prior to surgery with your employer. 13. Prescriptions will be provided immediately following surgery by your doctor.  Fill these as soon as possible after surgery and take the medication as directed. Pain medications will not be refilled on weekends and must be approved by the doctor. 14. Remove nail polish on the operative foot and avoid getting pedicures prior to surgery. 15. Wash the night before surgery.  The night before surgery wash the foot and leg well with water and the antibacterial soap provided. Be sure to pay special attention to beneath the toenails and in between the toes.  Wash for at least three (3) minutes. Rinse thoroughly with water and dry well with a towel.  Perform this wash unless told not to do so by your physician.  Enclosed: 1 Ice pack (please put in freezer the night before surgery)   1 Hibiclens skin cleaner     Pre-op instructions  If you have any questions regarding the instructions, please do not hesitate to call our office.  [place]: [address], [place], [place] [address] -- [phone]  [place]: [address] [place], [place] [address] -- [phone]  [place]: [address]  [place], [place] [address] -- [phone]  [place]: [address], [address], [place], [place] [address] -- [phone]  Website:  [email or web address] 

## 2019-03-18 ENCOUNTER — Other Ambulatory Visit: Payer: Self-pay | Admitting: Podiatry

## 2019-03-18 DIAGNOSIS — M779 Enthesopathy, unspecified: Secondary | ICD-10-CM

## 2019-03-19 DIAGNOSIS — M7541 Impingement syndrome of right shoulder: Secondary | ICD-10-CM | POA: Diagnosis not present

## 2019-03-23 NOTE — Progress Notes (Signed)
Subjective: 73 year old female presents the office in her surgical consultation for removal of hardware in the right foot.  She states that she gets pain on the hardware site daily and generalized achiness of the foot.  She is working on getting orthotics comfortable however she still thinks a lot of her discomfort is currently a point she wants to go and have this removed.  I discussed this with her on several occasions.  We also discussed about 2 weeks ago when she was in the office to pick up orthotics. Denies any systemic complaints such as fevers, chills, nausea, vomiting. No acute changes since last appointment, and no other complaints at this time.   Objective: AAO x3, NAD DP/PT pulses palpable bilaterally, CRT less than 3 seconds Hardware is prominent and palpable on the dorsal aspect of the right foot.  At this time there is no significant discomfort submetatarsal area.  There is no areas of pinpoint tenderness.  Mild edema to the foot.  No open lesions or pre-ulcerative lesions.  No pain with calf compression, swelling, warmth, erythema  Assessment: Prominent hardware right foot  Plan: -All treatment options discussed with the patient including all alternatives, risks, complications.  -X-rays were obtained and reviewed.  No evidence of acute fracture.  Osteopenia present.  Hardware intact. -At this time discussed both conservative as well as surgical options.  She was to proceed with surgical intervention given the hardware.  We will plan to do this in the next couple weeks.  She also would like to do something to fill the holes.  Discussed DBM bone putty. -The incision placement as well as the postoperative course was discussed with the patient. I discussed risks of the surgery which include, but not limited to, infection, bleeding, pain, swelling, need for further surgery, delayed or nonhealing, painful or ugly scar, numbness or sensation changes, over/under correction, recurrence,  transfer lesions, further deformity, hardware failure, DVT/PE, loss of toe/foot. Patient understands these risks and wishes to proceed with surgery. The surgical consent was reviewed with the patient all 3 pages were signed. No promises or guarantees were given to the outcome of the procedure. All questions were answered to the best of my ability. Before the surgery the patient was encouraged to call the office if there is any further questions. The surgery will be performed at the [facility] on an outpatient basis. -Patient encouraged to call the office with any questions, concerns, change in symptoms.   Trula Slade DPM

## 2019-04-01 ENCOUNTER — Other Ambulatory Visit: Payer: Self-pay | Admitting: Podiatry

## 2019-04-01 ENCOUNTER — Encounter: Payer: Self-pay | Admitting: Podiatry

## 2019-04-01 DIAGNOSIS — E7800 Pure hypercholesterolemia, unspecified: Secondary | ICD-10-CM | POA: Diagnosis not present

## 2019-04-01 DIAGNOSIS — Z4889 Encounter for other specified surgical aftercare: Secondary | ICD-10-CM | POA: Diagnosis not present

## 2019-04-01 DIAGNOSIS — Z967 Presence of other bone and tendon implants: Secondary | ICD-10-CM | POA: Diagnosis not present

## 2019-04-01 DIAGNOSIS — Z472 Encounter for removal of internal fixation device: Secondary | ICD-10-CM | POA: Diagnosis not present

## 2019-04-01 MED ORDER — PROMETHAZINE HCL 25 MG PO TABS: 25.0000 mg | ORAL_TABLET | Freq: Three times a day (TID) | ORAL | 0 refills | Status: DC | PRN

## 2019-04-01 MED ORDER — CEPHALEXIN 500 MG PO CAPS: 500.0000 mg | ORAL_CAPSULE | Freq: Three times a day (TID) | ORAL | 0 refills | Status: DC

## 2019-04-01 MED ORDER — ACETAMINOPHEN-CODEINE #3 300-30 MG PO TABS: 1.0000 | ORAL_TABLET | Freq: Four times a day (QID) | ORAL | 0 refills | Status: AC | PRN

## 2019-04-01 NOTE — Progress Notes (Signed)
Pre-operative Note  Patient presents to the [facility] today for surgical intervention of the right foot for hardware removal. The surgical consent was reviewed with the patient and we discussed the procedure as well as the postoperative course. I again discussed all alternatives, risks, complications. I answered all of their questions to the best of my ability and they wish to proceed with surgery. No promises or guarantees were given as to the outcome of the surgery.   The surgical consent was signed.   Patient is NPO since midnight.  The patient does not have have a history of blood clots or bleeding disorders.   Postop medications sent to pharmacy  Will call husband with update after surgery- patient gave permission.   No further questions.   Celesta Gentile, [place]

## 2019-04-02 ENCOUNTER — Other Ambulatory Visit: Payer: Self-pay | Admitting: Podiatry

## 2019-04-02 ENCOUNTER — Telehealth: Payer: Self-pay | Admitting: *Deleted

## 2019-04-02 MED ORDER — HYDROCODONE-ACETAMINOPHEN 5-325 MG PO TABS: 1.0000 | ORAL_TABLET | Freq: Four times a day (QID) | ORAL | 0 refills | Status: DC | PRN

## 2019-04-02 NOTE — Telephone Encounter (Signed)
Pt called states she is in severe pain burning after surgery yesterday and the tylenol #3 is not helping.

## 2019-04-02 NOTE — Telephone Encounter (Signed)
Dr. Jacqualyn Posey states he will order the norco and instruct pt that if she has any problem to stop the medication immediately. I spoke with pt's husband, Mikki Santee and informed of Dr. Leigh Aurora orders. Pt also spoke on the phone and stated removing the boot and ace did not help. I told pt to continue with the ice behind the leg or near the ankle and also try dangling to see if helps. Pt states she did dangle the foot when she went to the bathroom. I told pt the burning pain may be incision pain and should be helped with icing and the new pain medication. I told pt she could take the Norco at about 4:00pm and could take another Aleve now, and one every 12 hours.

## 2019-04-02 NOTE — Progress Notes (Signed)
Sent vicodin to the pharmacy- she states that she can take this. We talked about it yesterday but was hesitant given listed allergies. She states that she can take this and not had an issue and it was the demerol she took that caused issues.

## 2019-04-02 NOTE — Telephone Encounter (Addendum)
Pt states the burning started about 11:30am when in the kitchen eating. I told pt that having the foot below her heart, may have triggered the increased burning. I told pt go back to the sofa, or bed, up the foot up, remove the surgery boot, open-ended sock and ace wrap only, leave the gauze in place, elevate the foot for 15 minute, but if the pain worsened, dangle for 15 minutes this being the only time it was okay to have the foot below the heart, after 15 minutes then put foot level with the hip and beginning at the toes rewrap the ace rolling looser down the foot and up the leg, replace sock and boot. Pt asked if she could ice at that time and I told her yes. Pt states she took an Aleve also about 1 hour ago. I told her I would inform Dr. Jacqualyn Posey of pt's increased pain. Pt states remind Dr. Jacqualyn Posey that the Demerol he gave her last time caused jerking, but she could take the codeine.

## 2019-04-02 NOTE — Telephone Encounter (Signed)
Pt's husband, Mikki Santee states pt is in severe pain and crying and is in a "bad state".

## 2019-04-07 ENCOUNTER — Other Ambulatory Visit: Payer: Self-pay

## 2019-04-07 ENCOUNTER — Ambulatory Visit (INDEPENDENT_AMBULATORY_CARE_PROVIDER_SITE_OTHER): Payer: Self-pay | Admitting: Podiatry

## 2019-04-07 ENCOUNTER — Ambulatory Visit (INDEPENDENT_AMBULATORY_CARE_PROVIDER_SITE_OTHER): Payer: Medicare Other

## 2019-04-07 VITALS — Temp 97.9°F

## 2019-04-07 DIAGNOSIS — M79671 Pain in right foot: Secondary | ICD-10-CM

## 2019-04-07 DIAGNOSIS — Z969 Presence of functional implant, unspecified: Secondary | ICD-10-CM

## 2019-04-07 DIAGNOSIS — M779 Enthesopathy, unspecified: Secondary | ICD-10-CM

## 2019-04-07 DIAGNOSIS — Z9889 Other specified postprocedural states: Secondary | ICD-10-CM

## 2019-04-07 DIAGNOSIS — M778 Other enthesopathies, not elsewhere classified: Secondary | ICD-10-CM

## 2019-04-07 MED ORDER — HYDROCODONE-ACETAMINOPHEN 5-325 MG PO TABS: 1.0000 | ORAL_TABLET | Freq: Four times a day (QID) | ORAL | 0 refills | Status: DC | PRN

## 2019-04-08 ENCOUNTER — Other Ambulatory Visit: Payer: Self-pay | Admitting: Podiatry

## 2019-04-08 DIAGNOSIS — M778 Other enthesopathies, not elsewhere classified: Secondary | ICD-10-CM

## 2019-04-08 NOTE — Progress Notes (Signed)
Subjective: Alexandra Cain is a 73 y.o. is seen today in office s/p right foot removal of hardware preformed on 04/01/2019.  She is taking Vicodin for pain but overall she states that her pain is improving.  Denies any systemic complaints such as fevers, chills, nausea, vomiting. No calf pain, chest pain, shortness of breath.   Objective: General: No acute distress, AAOx3  DP/PT pulses palpable 2/4, CRT < 3 sec to all digits.  Protective sensation intact. Motor function intact.  RIGHT foot: Incision is well coapted without any evidence of dehiscence and sutures are intact. There is no surrounding erythema, ascending cellulitis, fluctuance, crepitus, malodor, drainage/purulence. There is mild edema around the surgical site. There is mild pain along the surgical site.  No other areas of tenderness to bilateral lower extremities.  No other open lesions or pre-ulcerative lesions.  No pain with calf compression, swelling, warmth, erythema.   Assessment and Plan:  Status post right foot removal of hardware, doing well with no complications   -Treatment options discussed including all alternatives, risks, and complications -X-rays were obtained and reviewed.  Status post hardware removal without any complicating factors. -Antibiotic ointment was applied followed by dry sterile dressing. -Remain in surgical shoe.  She can start to transition to weightbearing as tolerated in the cam boot. -Ice/elevation -Pain medication as needed-refilled today -Monitor for any clinical signs or symptoms of infection and DVT/PE and directed to call the office immediately should any occur or go to the ER. -Follow-up as scheduled for possible suture removal or sooner if any problems arise. In the meantime, encouraged to call the office with any questions, concerns, change in symptoms.   Celesta Gentile, DPM

## 2019-04-10 ENCOUNTER — Other Ambulatory Visit: Payer: Medicare Other

## 2019-04-13 DIAGNOSIS — M7541 Impingement syndrome of right shoulder: Secondary | ICD-10-CM | POA: Diagnosis not present

## 2019-04-15 DIAGNOSIS — R29898 Other symptoms and signs involving the musculoskeletal system: Secondary | ICD-10-CM | POA: Diagnosis not present

## 2019-04-15 DIAGNOSIS — M7541 Impingement syndrome of right shoulder: Secondary | ICD-10-CM | POA: Diagnosis not present

## 2019-04-15 DIAGNOSIS — M79601 Pain in right arm: Secondary | ICD-10-CM | POA: Diagnosis not present

## 2019-04-15 DIAGNOSIS — M25611 Stiffness of right shoulder, not elsewhere classified: Secondary | ICD-10-CM | POA: Diagnosis not present

## 2019-04-17 ENCOUNTER — Ambulatory Visit (INDEPENDENT_AMBULATORY_CARE_PROVIDER_SITE_OTHER): Payer: Medicare Other | Admitting: Podiatry

## 2019-04-17 ENCOUNTER — Other Ambulatory Visit: Payer: Self-pay

## 2019-04-17 DIAGNOSIS — Z969 Presence of functional implant, unspecified: Secondary | ICD-10-CM

## 2019-04-21 ENCOUNTER — Other Ambulatory Visit: Payer: Self-pay | Admitting: Internal Medicine

## 2019-04-21 DIAGNOSIS — E785 Hyperlipidemia, unspecified: Secondary | ICD-10-CM | POA: Diagnosis not present

## 2019-04-21 DIAGNOSIS — H6980 Other specified disorders of Eustachian tube, unspecified ear: Secondary | ICD-10-CM | POA: Diagnosis not present

## 2019-04-21 DIAGNOSIS — R911 Solitary pulmonary nodule: Secondary | ICD-10-CM | POA: Diagnosis not present

## 2019-04-21 DIAGNOSIS — Z1231 Encounter for screening mammogram for malignant neoplasm of breast: Secondary | ICD-10-CM | POA: Diagnosis not present

## 2019-04-21 DIAGNOSIS — M85859 Other specified disorders of bone density and structure, unspecified thigh: Secondary | ICD-10-CM | POA: Diagnosis not present

## 2019-04-21 DIAGNOSIS — D126 Benign neoplasm of colon, unspecified: Secondary | ICD-10-CM | POA: Diagnosis not present

## 2019-04-21 DIAGNOSIS — E559 Vitamin D deficiency, unspecified: Secondary | ICD-10-CM | POA: Diagnosis not present

## 2019-04-21 DIAGNOSIS — Z0000 Encounter for general adult medical examination without abnormal findings: Secondary | ICD-10-CM | POA: Diagnosis not present

## 2019-04-21 DIAGNOSIS — Z1211 Encounter for screening for malignant neoplasm of colon: Secondary | ICD-10-CM | POA: Diagnosis not present

## 2019-04-21 DIAGNOSIS — Z23 Encounter for immunization: Secondary | ICD-10-CM | POA: Diagnosis not present

## 2019-04-21 DIAGNOSIS — K219 Gastro-esophageal reflux disease without esophagitis: Secondary | ICD-10-CM | POA: Diagnosis not present

## 2019-04-22 DIAGNOSIS — R29898 Other symptoms and signs involving the musculoskeletal system: Secondary | ICD-10-CM | POA: Diagnosis not present

## 2019-04-22 DIAGNOSIS — M25611 Stiffness of right shoulder, not elsewhere classified: Secondary | ICD-10-CM | POA: Diagnosis not present

## 2019-04-22 DIAGNOSIS — M7541 Impingement syndrome of right shoulder: Secondary | ICD-10-CM | POA: Diagnosis not present

## 2019-04-22 DIAGNOSIS — M79601 Pain in right arm: Secondary | ICD-10-CM | POA: Diagnosis not present

## 2019-04-22 MED ORDER — IBUPROFEN 800 MG PO TABS: 800.0000 mg | ORAL_TABLET | Freq: Three times a day (TID) | ORAL | 0 refills | Status: DC | PRN

## 2019-04-22 NOTE — Progress Notes (Signed)
Subjective: Alexandra Cain is a 72 y.o. is seen today in office s/p right foot removal of hardware preformed on 04/01/2019.  She presents today for possible suture removal.  She still taking Vicodin for pain but try to decrease.  She still in the cam boot and she is still using crutches. Denies any systemic complaints such as fevers, chills, nausea, vomiting. No calf pain, chest pain, shortness of breath.   Objective: General: No acute distress, AAOx3  DP/PT pulses palpable 2/4, CRT < 3 sec to all digits.  Protective sensation intact. Motor function intact.  RIGHT foot: Incision is well coapted without any evidence of dehiscence and sutures are intact. There is no surrounding erythema, ascending cellulitis, fluctuance, crepitus, malodor, drainage/purulence. There is mild edema around the surgical site. There is mild however improving pain along the surgical site.  Mild numbness on the incision site.  No numbness in the toes.  Range of motion to the toes intact.  Overall she still doing well. No other areas of tenderness to bilateral lower extremities.  No other open lesions or pre-ulcerative lesions.  No pain with calf compression, swelling, warmth, erythema.   Assessment and Plan:  Status post right foot removal of hardware, doing well with no complications   -Treatment options discussed including all alternatives, risks, and complications -I reviewed the sutures today without any complications.  Antibiotic ointment and a dressing was applied.  Keep the dressing clean, dry, intact. -Pain medication as needed.  She can also use ibuprofen as needed. -She can start to transition to weight-bear as tolerated in the cam boot.  Ice elevation.  Return in about 1 week (around 04/24/2019).  Trula Slade DPM

## 2019-04-23 DIAGNOSIS — E559 Vitamin D deficiency, unspecified: Secondary | ICD-10-CM | POA: Diagnosis not present

## 2019-04-24 ENCOUNTER — Other Ambulatory Visit: Payer: Self-pay

## 2019-04-24 ENCOUNTER — Ambulatory Visit (INDEPENDENT_AMBULATORY_CARE_PROVIDER_SITE_OTHER): Payer: Medicare Other | Admitting: Podiatry

## 2019-04-24 ENCOUNTER — Encounter: Payer: Self-pay | Admitting: Podiatry

## 2019-04-24 DIAGNOSIS — M25611 Stiffness of right shoulder, not elsewhere classified: Secondary | ICD-10-CM | POA: Diagnosis not present

## 2019-04-24 DIAGNOSIS — M7541 Impingement syndrome of right shoulder: Secondary | ICD-10-CM | POA: Diagnosis not present

## 2019-04-24 DIAGNOSIS — R29898 Other symptoms and signs involving the musculoskeletal system: Secondary | ICD-10-CM | POA: Diagnosis not present

## 2019-04-24 DIAGNOSIS — M79601 Pain in right arm: Secondary | ICD-10-CM | POA: Diagnosis not present

## 2019-04-24 DIAGNOSIS — M79671 Pain in right foot: Secondary | ICD-10-CM

## 2019-04-24 DIAGNOSIS — Z969 Presence of functional implant, unspecified: Secondary | ICD-10-CM

## 2019-04-24 MED ORDER — CEPHALEXIN 500 MG PO CAPS: 500.0000 mg | ORAL_CAPSULE | Freq: Three times a day (TID) | ORAL | 0 refills | Status: DC

## 2019-04-24 MED ORDER — GABAPENTIN 100 MG PO CAPS: 100.0000 mg | ORAL_CAPSULE | Freq: Every day | ORAL | 1 refills | Status: DC

## 2019-04-24 NOTE — Patient Instructions (Signed)

## 2019-04-27 DIAGNOSIS — M7541 Impingement syndrome of right shoulder: Secondary | ICD-10-CM | POA: Diagnosis not present

## 2019-04-27 DIAGNOSIS — M25611 Stiffness of right shoulder, not elsewhere classified: Secondary | ICD-10-CM | POA: Diagnosis not present

## 2019-04-27 DIAGNOSIS — M79601 Pain in right arm: Secondary | ICD-10-CM | POA: Diagnosis not present

## 2019-04-27 DIAGNOSIS — R29898 Other symptoms and signs involving the musculoskeletal system: Secondary | ICD-10-CM | POA: Diagnosis not present

## 2019-04-29 DIAGNOSIS — R29898 Other symptoms and signs involving the musculoskeletal system: Secondary | ICD-10-CM | POA: Diagnosis not present

## 2019-04-29 DIAGNOSIS — M25611 Stiffness of right shoulder, not elsewhere classified: Secondary | ICD-10-CM | POA: Diagnosis not present

## 2019-04-29 DIAGNOSIS — M79601 Pain in right arm: Secondary | ICD-10-CM | POA: Diagnosis not present

## 2019-04-29 DIAGNOSIS — M7541 Impingement syndrome of right shoulder: Secondary | ICD-10-CM | POA: Diagnosis not present

## 2019-04-30 NOTE — Progress Notes (Signed)
Subjective: Alexandra Cain is a 73 y.o. is seen today in office s/p right foot removal of hardware preformed on 04/01/2019.  She presents today for possible suture removal.  She is still using the cam boot she still using crutches.  Pain is improved and she is now taking narcotic pain medication.  Denies any systemic complaints such as fevers, chills, nausea, vomiting. No calf pain, chest pain, shortness of breath.   Objective: General: No acute distress, AAOx3  DP/PT pulses palpable 2/4, CRT < 3 sec to all digits.  Protective sensation intact. Motor function intact.  RIGHT foot: Incision is well coapted without any evidence of dehiscence and sutures are intact.  There is very faint erythema on the proximal aspect incision but there is no ascending cellulitis.  No drainage or pus.  Is able to remove the sutures today and incision is well coapted without any dehiscence.  Mild swelling.  Range of motion improved to the digits.  No pain with calf compression, swelling, warmth, erythema.   Assessment and Plan:  Status post right foot removal of hardware, doing well with no complications   -Treatment options discussed including all alternatives, risks, and complications -Sutures removed today without any complications.  Incision is well coapted.  I want her to keep antibiotic ointment and a dressing on the incision daily.  As a precaution prescribe Keflex given the mild erythema.  Return in about 10 days (around 05/04/2019).  Trula Slade DPM

## 2019-05-01 DIAGNOSIS — H903 Sensorineural hearing loss, bilateral: Secondary | ICD-10-CM | POA: Diagnosis not present

## 2019-05-01 DIAGNOSIS — M7541 Impingement syndrome of right shoulder: Secondary | ICD-10-CM | POA: Diagnosis not present

## 2019-05-01 DIAGNOSIS — H6123 Impacted cerumen, bilateral: Secondary | ICD-10-CM | POA: Diagnosis not present

## 2019-05-04 ENCOUNTER — Encounter: Payer: Self-pay | Admitting: Podiatry

## 2019-05-04 ENCOUNTER — Ambulatory Visit (INDEPENDENT_AMBULATORY_CARE_PROVIDER_SITE_OTHER): Payer: Self-pay | Admitting: Podiatry

## 2019-05-04 ENCOUNTER — Other Ambulatory Visit: Payer: Self-pay

## 2019-05-04 DIAGNOSIS — M25611 Stiffness of right shoulder, not elsewhere classified: Secondary | ICD-10-CM | POA: Diagnosis not present

## 2019-05-04 DIAGNOSIS — M79601 Pain in right arm: Secondary | ICD-10-CM | POA: Diagnosis not present

## 2019-05-04 DIAGNOSIS — Z9889 Other specified postprocedural states: Secondary | ICD-10-CM

## 2019-05-04 DIAGNOSIS — M7541 Impingement syndrome of right shoulder: Secondary | ICD-10-CM | POA: Diagnosis not present

## 2019-05-04 DIAGNOSIS — R29898 Other symptoms and signs involving the musculoskeletal system: Secondary | ICD-10-CM | POA: Diagnosis not present

## 2019-05-04 DIAGNOSIS — Z969 Presence of functional implant, unspecified: Secondary | ICD-10-CM

## 2019-05-04 NOTE — Progress Notes (Signed)
Subjective:  Patient ID: Alexandra Cain, female    DOB: 11/19/1945,  MRN: [id number]  Chief Complaint  Patient presents with  . Foot Pain    pt is here for a 1 week f/u, pt states that she is not feeling any better, but is not feeling any worse as well ,pt does have some redness around the incision site    DOS: 04/01/19 Procedure: s/p Right hardware removal  73 y.o. female returns for post-op check. She is doing well. She states that there is mild pain to the incision area however, there she is concerend with redness around the incision. She has completed her course of antibiotics.  Review of Systems: Negative except as noted in the HPI. Denies N/V/F/Ch.  Past Medical History:  Diagnosis Date  . Endometriosis 3/96   bx neg, suspicious for endo  . Frozen shoulder 2010  . Infertility, female   . Menopause   . Ulcer     Current Outpatient Medications:  .  Ascorbic Acid (VITAMIN C PO), Take by mouth., Disp: , Rfl:  .  cephALEXin (KEFLEX) 500 MG capsule, Take 1 capsule (500 mg total) by mouth 3 (three) times daily., Disp: 21 capsule, Rfl: 0 .  cholecalciferol (VITAMIN D) 1000 UNITS tablet, Take 1,000 Units by mouth daily., Disp: , Rfl:  .  Coenzyme Q10 (CO Q 10) 100 MG CAPS, Take by mouth daily., Disp: , Rfl:  .  estradiol (ESTRACE) 0.5 MG tablet, Take 1 tablet (0.5 mg total) by mouth daily., Disp: 90 tablet, Rfl: 4 .  fluticasone (FLONASE) 50 MCG/ACT nasal spray, Place 1 spray into both nostrils 2 (two) times a day. , Disp: , Rfl:  .  gabapentin (NEURONTIN) 100 MG capsule, Take 1 capsule (100 mg total) by mouth at bedtime., Disp: 30 capsule, Rfl: 1 .  HYDROcodone-acetaminophen (NORCO/VICODIN) 5-325 MG tablet, Take 1 tablet by mouth every 6 (six) hours as needed., Disp: 20 tablet, Rfl: 0 .  ibuprofen (ADVIL) 800 MG tablet, Take 1 tablet (800 mg total) by mouth every 8 (eight) hours as needed., Disp: 30 tablet, Rfl: 0 .  medroxyPROGESTERone (PROVERA) 2.5 MG tablet, Take 1 tablet (2.5 mg  total) by mouth daily., Disp: 90 tablet, Rfl: 4 .  Multiple Vitamin (MULTIVITAMIN) tablet, Take 1 tablet by mouth daily., Disp: , Rfl:  .  Multiple Vitamins-Minerals (PRESERVISION AREDS PO), Take 2 tablets by mouth daily., Disp: , Rfl:  .  promethazine (PHENERGAN) 25 MG tablet, Take 1 tablet (25 mg total) by mouth every 8 (eight) hours as needed for nausea or vomiting., Disp: 20 tablet, Rfl: 0 .  rosuvastatin (CRESTOR) 5 MG tablet, Take 1 tablet by mouth daily., Disp: , Rfl:   Social History   Tobacco Use  Smoking Status Never Smoker  Smokeless Tobacco Never Used    Allergies  Allergen Reactions  . Demerol [Meperidine Hcl]     Uncontrollable jerking.  Marland Kitchen Hydrocodone-Acetaminophen Hives and Itching    Pt stated, "It made my heart skip a beat and made me itch"   Objective:  There were no vitals filed for this visit. There is no height or weight on file to calculate BMI. Constitutional Well developed. Well nourished.  Vascular Foot warm and well perfused. Capillary refill normal to all digits.   Neurologic Normal speech. Oriented to person, place, and time. Epicritic sensation to light touch grossly present bilaterally.  Dermatologic Skin healing well without signs of infection. Skin edges well coapted without signs of infection. Steri strips are intact  Orthopedic: Tenderness to palpation noted about the surgical site.   Radiographs: None Assessment:   1. Status post right foot surgery   2. Presence of retained hardware    Plan:  Patient was evaluated and treated and all questions answered.  S/p foot surgery right foot hardware removal -I do not think the periwound erythema/rubor is related to the infection.  I believe there is some sort of irritant component to it that may be causing some of the redness.  I gave her some Benadryl to see if that could improve the redness/inflammation -Progressing as expected post-operatively. -XR: None -WB Status: WBAT in CAM boot -Foot  redressed.  No follow-ups on file.

## 2019-05-06 DIAGNOSIS — M7541 Impingement syndrome of right shoulder: Secondary | ICD-10-CM | POA: Diagnosis not present

## 2019-05-06 DIAGNOSIS — M25611 Stiffness of right shoulder, not elsewhere classified: Secondary | ICD-10-CM | POA: Diagnosis not present

## 2019-05-06 DIAGNOSIS — M79601 Pain in right arm: Secondary | ICD-10-CM | POA: Diagnosis not present

## 2019-05-06 DIAGNOSIS — R29898 Other symptoms and signs involving the musculoskeletal system: Secondary | ICD-10-CM | POA: Diagnosis not present

## 2019-05-07 DIAGNOSIS — Z23 Encounter for immunization: Secondary | ICD-10-CM | POA: Diagnosis not present

## 2019-05-11 ENCOUNTER — Encounter: Payer: Self-pay | Admitting: Podiatry

## 2019-05-11 ENCOUNTER — Other Ambulatory Visit: Payer: Self-pay

## 2019-05-11 ENCOUNTER — Ambulatory Visit (INDEPENDENT_AMBULATORY_CARE_PROVIDER_SITE_OTHER): Payer: Self-pay | Admitting: Podiatry

## 2019-05-11 DIAGNOSIS — Z9889 Other specified postprocedural states: Secondary | ICD-10-CM

## 2019-05-11 DIAGNOSIS — M25611 Stiffness of right shoulder, not elsewhere classified: Secondary | ICD-10-CM | POA: Diagnosis not present

## 2019-05-11 DIAGNOSIS — Z969 Presence of functional implant, unspecified: Secondary | ICD-10-CM

## 2019-05-11 DIAGNOSIS — M79601 Pain in right arm: Secondary | ICD-10-CM | POA: Diagnosis not present

## 2019-05-11 DIAGNOSIS — M7541 Impingement syndrome of right shoulder: Secondary | ICD-10-CM | POA: Diagnosis not present

## 2019-05-11 DIAGNOSIS — R29898 Other symptoms and signs involving the musculoskeletal system: Secondary | ICD-10-CM | POA: Diagnosis not present

## 2019-05-12 NOTE — Progress Notes (Signed)
Subjective: Alexandra Cain is a 73 y.o. is seen today in office s/p right foot removal of hardware preformed on 04/01/2019.  She presents today for follow-up evaluation.  She states that she is doing well although she is having numbness to her big toe.  The gabapentin she states has been very helpful.  She followed up last week with Dr. Posey Pronto.  He was concerned that there was more allergic reaction causing erythema she was placed on Benadryl.  She took 1 tablet and caused her to sleep so she stopped taking this.  She feels that the boot is rubbing. Denies any systemic complaints such as fevers, chills, nausea, vomiting. No calf pain, chest pain, shortness of breath.   Objective: General: No acute distress, AAOx3  DP/PT pulses palpable 2/4, CRT < 3 sec to all digits.  Protective sensation intact. Motor function intact.  RIGHT foot: Incision is well coapted without any evidence of dehiscence and Steri-Strips were intact.  Upon removal there is macerated tissue.  She states that she has been putting a lot of antibiotic ointment on the incision.  There is no signs of dehiscence the area of erythema that I saw last appointment has almost completely resolved.  There is no increase in warmth.  No pain with calf compression, swelling, warmth, erythema.   Assessment and Plan:  Status post right foot removal of hardware, neuritis  -Treatment options discussed including all alternatives, risks, and complications -Incision is doing well.  I want her to start to wash the foot with soap and water and dry thoroughly.  Apply small amount of antibiotic ointment.  She did start to transition to a surgical shoe.  Continue ice elevate. -Continue gabapentin  Return in about 2 weeks (around 05/25/2019).   Trula Slade DPM

## 2019-05-13 DIAGNOSIS — M7541 Impingement syndrome of right shoulder: Secondary | ICD-10-CM | POA: Diagnosis not present

## 2019-05-13 DIAGNOSIS — M25611 Stiffness of right shoulder, not elsewhere classified: Secondary | ICD-10-CM | POA: Diagnosis not present

## 2019-05-13 DIAGNOSIS — M79601 Pain in right arm: Secondary | ICD-10-CM | POA: Diagnosis not present

## 2019-05-13 DIAGNOSIS — R29898 Other symptoms and signs involving the musculoskeletal system: Secondary | ICD-10-CM | POA: Diagnosis not present

## 2019-05-19 DIAGNOSIS — M25611 Stiffness of right shoulder, not elsewhere classified: Secondary | ICD-10-CM | POA: Diagnosis not present

## 2019-05-19 DIAGNOSIS — M79601 Pain in right arm: Secondary | ICD-10-CM | POA: Diagnosis not present

## 2019-05-19 DIAGNOSIS — M7541 Impingement syndrome of right shoulder: Secondary | ICD-10-CM | POA: Diagnosis not present

## 2019-05-19 DIAGNOSIS — R29898 Other symptoms and signs involving the musculoskeletal system: Secondary | ICD-10-CM | POA: Diagnosis not present

## 2019-05-22 DIAGNOSIS — M7541 Impingement syndrome of right shoulder: Secondary | ICD-10-CM | POA: Diagnosis not present

## 2019-05-22 DIAGNOSIS — R29898 Other symptoms and signs involving the musculoskeletal system: Secondary | ICD-10-CM | POA: Diagnosis not present

## 2019-05-22 DIAGNOSIS — M79601 Pain in right arm: Secondary | ICD-10-CM | POA: Diagnosis not present

## 2019-05-22 DIAGNOSIS — M25611 Stiffness of right shoulder, not elsewhere classified: Secondary | ICD-10-CM | POA: Diagnosis not present

## 2019-05-25 ENCOUNTER — Other Ambulatory Visit: Payer: Self-pay

## 2019-05-25 ENCOUNTER — Ambulatory Visit (INDEPENDENT_AMBULATORY_CARE_PROVIDER_SITE_OTHER): Payer: Medicare Other | Admitting: Podiatry

## 2019-05-25 DIAGNOSIS — M79671 Pain in right foot: Secondary | ICD-10-CM

## 2019-05-25 DIAGNOSIS — Z969 Presence of functional implant, unspecified: Secondary | ICD-10-CM

## 2019-05-25 DIAGNOSIS — Z9889 Other specified postprocedural states: Secondary | ICD-10-CM

## 2019-05-26 ENCOUNTER — Telehealth: Payer: Self-pay | Admitting: *Deleted

## 2019-05-26 DIAGNOSIS — M7541 Impingement syndrome of right shoulder: Secondary | ICD-10-CM | POA: Diagnosis not present

## 2019-05-26 DIAGNOSIS — M79601 Pain in right arm: Secondary | ICD-10-CM | POA: Diagnosis not present

## 2019-05-26 DIAGNOSIS — R29898 Other symptoms and signs involving the musculoskeletal system: Secondary | ICD-10-CM | POA: Diagnosis not present

## 2019-05-26 DIAGNOSIS — M25611 Stiffness of right shoulder, not elsewhere classified: Secondary | ICD-10-CM | POA: Diagnosis not present

## 2019-05-26 NOTE — Progress Notes (Signed)
Subjective: Alexandra Cain is a 73 y.o. is seen today in office s/p right foot removal of hardware preformed on 04/01/2019.  Overall she states that she is feeling better and she feels that she is ready to wear a regular shoe.  She did bring a shoe with her today.  She states that the pain is much improved on top of the foot.  She still has some numbness in the big toe which is aggravating to her.  No recent injury.  She has been working some range of motion to her toes.  She feels that she should go back to physical therapy at this time. Denies any systemic complaints such as fevers, chills, nausea, vomiting. No calf pain, chest pain, shortness of breath.   Objective: General: No acute distress, AAOx3  DP/PT pulses palpable 2/4, CRT < 3 sec to all digits.  Protective sensation intact. Motor function intact.  RIGHT foot: Incision is well coapted without any evidence of dehiscence and scar is well formed.  There is no surrounding erythema, ascending cellulitis.  No fluctuation crepitation there is no clinical signs of infection.  She still has numbness on the big toe lateral aspect.  There is decreased motion of the MPJs and they appear to be stiff upon passive range of motion.   No pain with calf compression, swelling, warmth, erythema.   Assessment and Plan:  Status post right foot removal of hardware, neuritis  -Treatment options discussed including all alternatives, risks, and complications -Overall she is doing much better.  She can start to transition to regular shoe.  Discussed with her gradual transition.  Continue ice elevate.  I would wear compression, swelling as well as she transitions.  We will start physical therapy as well.  [facility]cription for [facility] was written.  Return in about 3 weeks (around 06/15/2019).  Trula Slade DPM

## 2019-05-26 NOTE — Telephone Encounter (Signed)
Called and spoke with the patient and stated that per Dr Jacqualyn Posey did not want the patient to do physical therapy the same day as the patient is doing physical therapy for the shoulder and I stated Lynn Ito will be calling and getting the appointment scheduled. Lattie Haw

## 2019-05-26 NOTE — Addendum Note (Signed)
Addended by: Cranford Mon R on: 05/26/2019 07:57 AM   Modules accepted: Orders

## 2019-05-27 DIAGNOSIS — M25674 Stiffness of right foot, not elsewhere classified: Secondary | ICD-10-CM | POA: Diagnosis not present

## 2019-05-27 DIAGNOSIS — M79671 Pain in right foot: Secondary | ICD-10-CM | POA: Diagnosis not present

## 2019-05-27 DIAGNOSIS — M25474 Effusion, right foot: Secondary | ICD-10-CM | POA: Diagnosis not present

## 2019-05-27 DIAGNOSIS — M25671 Stiffness of right ankle, not elsewhere classified: Secondary | ICD-10-CM | POA: Diagnosis not present

## 2019-05-27 DIAGNOSIS — R269 Unspecified abnormalities of gait and mobility: Secondary | ICD-10-CM | POA: Diagnosis not present

## 2019-05-29 DIAGNOSIS — M79601 Pain in right arm: Secondary | ICD-10-CM | POA: Diagnosis not present

## 2019-05-29 DIAGNOSIS — M25611 Stiffness of right shoulder, not elsewhere classified: Secondary | ICD-10-CM | POA: Diagnosis not present

## 2019-05-29 DIAGNOSIS — M7541 Impingement syndrome of right shoulder: Secondary | ICD-10-CM | POA: Diagnosis not present

## 2019-05-29 DIAGNOSIS — R29898 Other symptoms and signs involving the musculoskeletal system: Secondary | ICD-10-CM | POA: Diagnosis not present

## 2019-06-01 DIAGNOSIS — M25674 Stiffness of right foot, not elsewhere classified: Secondary | ICD-10-CM | POA: Diagnosis not present

## 2019-06-01 DIAGNOSIS — R269 Unspecified abnormalities of gait and mobility: Secondary | ICD-10-CM | POA: Diagnosis not present

## 2019-06-01 DIAGNOSIS — M79671 Pain in right foot: Secondary | ICD-10-CM | POA: Diagnosis not present

## 2019-06-01 DIAGNOSIS — M25671 Stiffness of right ankle, not elsewhere classified: Secondary | ICD-10-CM | POA: Diagnosis not present

## 2019-06-01 DIAGNOSIS — M25474 Effusion, right foot: Secondary | ICD-10-CM | POA: Diagnosis not present

## 2019-06-02 DIAGNOSIS — M25611 Stiffness of right shoulder, not elsewhere classified: Secondary | ICD-10-CM | POA: Diagnosis not present

## 2019-06-02 DIAGNOSIS — M79601 Pain in right arm: Secondary | ICD-10-CM | POA: Diagnosis not present

## 2019-06-02 DIAGNOSIS — R29898 Other symptoms and signs involving the musculoskeletal system: Secondary | ICD-10-CM | POA: Diagnosis not present

## 2019-06-02 DIAGNOSIS — M7541 Impingement syndrome of right shoulder: Secondary | ICD-10-CM | POA: Diagnosis not present

## 2019-06-04 DIAGNOSIS — M25474 Effusion, right foot: Secondary | ICD-10-CM | POA: Diagnosis not present

## 2019-06-04 DIAGNOSIS — M25671 Stiffness of right ankle, not elsewhere classified: Secondary | ICD-10-CM | POA: Diagnosis not present

## 2019-06-04 DIAGNOSIS — M25674 Stiffness of right foot, not elsewhere classified: Secondary | ICD-10-CM | POA: Diagnosis not present

## 2019-06-04 DIAGNOSIS — R269 Unspecified abnormalities of gait and mobility: Secondary | ICD-10-CM | POA: Diagnosis not present

## 2019-06-04 DIAGNOSIS — M79671 Pain in right foot: Secondary | ICD-10-CM | POA: Diagnosis not present

## 2019-06-08 DIAGNOSIS — M25671 Stiffness of right ankle, not elsewhere classified: Secondary | ICD-10-CM | POA: Diagnosis not present

## 2019-06-08 DIAGNOSIS — M25474 Effusion, right foot: Secondary | ICD-10-CM | POA: Diagnosis not present

## 2019-06-08 DIAGNOSIS — R269 Unspecified abnormalities of gait and mobility: Secondary | ICD-10-CM | POA: Diagnosis not present

## 2019-06-08 DIAGNOSIS — M25674 Stiffness of right foot, not elsewhere classified: Secondary | ICD-10-CM | POA: Diagnosis not present

## 2019-06-08 DIAGNOSIS — M79671 Pain in right foot: Secondary | ICD-10-CM | POA: Diagnosis not present

## 2019-06-09 DIAGNOSIS — M79601 Pain in right arm: Secondary | ICD-10-CM | POA: Diagnosis not present

## 2019-06-09 DIAGNOSIS — R29898 Other symptoms and signs involving the musculoskeletal system: Secondary | ICD-10-CM | POA: Diagnosis not present

## 2019-06-09 DIAGNOSIS — M7541 Impingement syndrome of right shoulder: Secondary | ICD-10-CM | POA: Diagnosis not present

## 2019-06-09 DIAGNOSIS — M25611 Stiffness of right shoulder, not elsewhere classified: Secondary | ICD-10-CM | POA: Diagnosis not present

## 2019-06-11 DIAGNOSIS — M25671 Stiffness of right ankle, not elsewhere classified: Secondary | ICD-10-CM | POA: Diagnosis not present

## 2019-06-11 DIAGNOSIS — M79671 Pain in right foot: Secondary | ICD-10-CM | POA: Diagnosis not present

## 2019-06-11 DIAGNOSIS — M25474 Effusion, right foot: Secondary | ICD-10-CM | POA: Diagnosis not present

## 2019-06-11 DIAGNOSIS — R269 Unspecified abnormalities of gait and mobility: Secondary | ICD-10-CM | POA: Diagnosis not present

## 2019-06-11 DIAGNOSIS — M25674 Stiffness of right foot, not elsewhere classified: Secondary | ICD-10-CM | POA: Diagnosis not present

## 2019-06-12 DIAGNOSIS — M79601 Pain in right arm: Secondary | ICD-10-CM | POA: Diagnosis not present

## 2019-06-12 DIAGNOSIS — M7541 Impingement syndrome of right shoulder: Secondary | ICD-10-CM | POA: Diagnosis not present

## 2019-06-12 DIAGNOSIS — R29898 Other symptoms and signs involving the musculoskeletal system: Secondary | ICD-10-CM | POA: Diagnosis not present

## 2019-06-12 DIAGNOSIS — M25611 Stiffness of right shoulder, not elsewhere classified: Secondary | ICD-10-CM | POA: Diagnosis not present

## 2019-06-15 ENCOUNTER — Other Ambulatory Visit: Payer: Self-pay

## 2019-06-15 ENCOUNTER — Ambulatory Visit (INDEPENDENT_AMBULATORY_CARE_PROVIDER_SITE_OTHER): Payer: Medicare Other | Admitting: Podiatry

## 2019-06-15 DIAGNOSIS — M25674 Stiffness of right foot, not elsewhere classified: Secondary | ICD-10-CM | POA: Diagnosis not present

## 2019-06-15 DIAGNOSIS — R269 Unspecified abnormalities of gait and mobility: Secondary | ICD-10-CM | POA: Diagnosis not present

## 2019-06-15 DIAGNOSIS — M79671 Pain in right foot: Secondary | ICD-10-CM

## 2019-06-15 DIAGNOSIS — Z969 Presence of functional implant, unspecified: Secondary | ICD-10-CM

## 2019-06-15 DIAGNOSIS — M25474 Effusion, right foot: Secondary | ICD-10-CM | POA: Diagnosis not present

## 2019-06-15 DIAGNOSIS — M25671 Stiffness of right ankle, not elsewhere classified: Secondary | ICD-10-CM | POA: Diagnosis not present

## 2019-06-15 DIAGNOSIS — Z9889 Other specified postprocedural states: Secondary | ICD-10-CM

## 2019-06-16 DIAGNOSIS — M7541 Impingement syndrome of right shoulder: Secondary | ICD-10-CM | POA: Diagnosis not present

## 2019-06-16 DIAGNOSIS — R29898 Other symptoms and signs involving the musculoskeletal system: Secondary | ICD-10-CM | POA: Diagnosis not present

## 2019-06-16 DIAGNOSIS — M25611 Stiffness of right shoulder, not elsewhere classified: Secondary | ICD-10-CM | POA: Diagnosis not present

## 2019-06-16 DIAGNOSIS — M79601 Pain in right arm: Secondary | ICD-10-CM | POA: Diagnosis not present

## 2019-06-17 DIAGNOSIS — R269 Unspecified abnormalities of gait and mobility: Secondary | ICD-10-CM | POA: Diagnosis not present

## 2019-06-17 DIAGNOSIS — M25671 Stiffness of right ankle, not elsewhere classified: Secondary | ICD-10-CM | POA: Diagnosis not present

## 2019-06-17 DIAGNOSIS — M79671 Pain in right foot: Secondary | ICD-10-CM | POA: Diagnosis not present

## 2019-06-17 DIAGNOSIS — M25474 Effusion, right foot: Secondary | ICD-10-CM | POA: Diagnosis not present

## 2019-06-17 DIAGNOSIS — M25674 Stiffness of right foot, not elsewhere classified: Secondary | ICD-10-CM | POA: Diagnosis not present

## 2019-06-22 DIAGNOSIS — M25674 Stiffness of right foot, not elsewhere classified: Secondary | ICD-10-CM | POA: Diagnosis not present

## 2019-06-22 DIAGNOSIS — M25671 Stiffness of right ankle, not elsewhere classified: Secondary | ICD-10-CM | POA: Diagnosis not present

## 2019-06-22 DIAGNOSIS — R269 Unspecified abnormalities of gait and mobility: Secondary | ICD-10-CM | POA: Diagnosis not present

## 2019-06-22 DIAGNOSIS — M25474 Effusion, right foot: Secondary | ICD-10-CM | POA: Diagnosis not present

## 2019-06-22 DIAGNOSIS — M79671 Pain in right foot: Secondary | ICD-10-CM | POA: Diagnosis not present

## 2019-06-22 NOTE — Progress Notes (Signed)
Subjective: Alexandra Cain is a 73 y.o. is seen today in office s/p right foot removal of hardware preformed on 04/01/2019.  She said that she is doing better.  Physical therapy has been helpful.  She still has the numbness to the hallux that she describes as irritating.  She is back to her regular shoe.  No recent injury or falls.  She does state it feels better than having the plate in.  Denies any systemic complaints such as fevers, chills, nausea, vomiting. No calf pain, chest pain, shortness of breath.   Objective: General: No acute distress, AAOx3  DP/PT pulses palpable 2/4, CRT < 3 sec to all digits.  Protective sensation intact. Motor function intact.  RIGHT foot: Incision is well coapted without any evidence of dehiscence and scar is well formed.  There is no surrounding erythema, ascending cellulitis.  No fluctuation crepitation there is no clinical signs of infection.  She still has numbness on the hallux. There is decreased however improved motion of the MPJs and they appear to be stiff upon passive range of motion.   No pain with calf compression, swelling, warmth, erythema.   Assessment and Plan:  Status post right foot removal of hardware, neuritis  -Treatment options discussed including all alternatives, risks, and complications -She is continue make progress.  I want her to continue wearing regular shoe and continue physical therapy.  Gradually increase activity level.  Compression anklet as needed.  However discussed not to wear this if she does not feel it is helping to put any pressure on the nerves.   Return in about 6 weeks (around 07/27/2019).  Trula Slade DPM

## 2019-06-23 DIAGNOSIS — M25611 Stiffness of right shoulder, not elsewhere classified: Secondary | ICD-10-CM | POA: Diagnosis not present

## 2019-06-23 DIAGNOSIS — M79601 Pain in right arm: Secondary | ICD-10-CM | POA: Diagnosis not present

## 2019-06-23 DIAGNOSIS — M7541 Impingement syndrome of right shoulder: Secondary | ICD-10-CM | POA: Diagnosis not present

## 2019-06-23 DIAGNOSIS — R29898 Other symptoms and signs involving the musculoskeletal system: Secondary | ICD-10-CM | POA: Diagnosis not present

## 2019-06-25 DIAGNOSIS — R269 Unspecified abnormalities of gait and mobility: Secondary | ICD-10-CM | POA: Diagnosis not present

## 2019-06-25 DIAGNOSIS — M25474 Effusion, right foot: Secondary | ICD-10-CM | POA: Diagnosis not present

## 2019-06-25 DIAGNOSIS — M25674 Stiffness of right foot, not elsewhere classified: Secondary | ICD-10-CM | POA: Diagnosis not present

## 2019-06-25 DIAGNOSIS — M25671 Stiffness of right ankle, not elsewhere classified: Secondary | ICD-10-CM | POA: Diagnosis not present

## 2019-06-25 DIAGNOSIS — M79671 Pain in right foot: Secondary | ICD-10-CM | POA: Diagnosis not present

## 2019-06-29 DIAGNOSIS — M79671 Pain in right foot: Secondary | ICD-10-CM | POA: Diagnosis not present

## 2019-06-29 DIAGNOSIS — R269 Unspecified abnormalities of gait and mobility: Secondary | ICD-10-CM | POA: Diagnosis not present

## 2019-06-29 DIAGNOSIS — M25474 Effusion, right foot: Secondary | ICD-10-CM | POA: Diagnosis not present

## 2019-06-29 DIAGNOSIS — M25674 Stiffness of right foot, not elsewhere classified: Secondary | ICD-10-CM | POA: Diagnosis not present

## 2019-06-29 DIAGNOSIS — M25671 Stiffness of right ankle, not elsewhere classified: Secondary | ICD-10-CM | POA: Diagnosis not present

## 2019-06-30 DIAGNOSIS — M79601 Pain in right arm: Secondary | ICD-10-CM | POA: Diagnosis not present

## 2019-06-30 DIAGNOSIS — M7541 Impingement syndrome of right shoulder: Secondary | ICD-10-CM | POA: Diagnosis not present

## 2019-06-30 DIAGNOSIS — R29898 Other symptoms and signs involving the musculoskeletal system: Secondary | ICD-10-CM | POA: Diagnosis not present

## 2019-06-30 DIAGNOSIS — M25611 Stiffness of right shoulder, not elsewhere classified: Secondary | ICD-10-CM | POA: Diagnosis not present

## 2019-07-03 DIAGNOSIS — R29898 Other symptoms and signs involving the musculoskeletal system: Secondary | ICD-10-CM | POA: Diagnosis not present

## 2019-07-03 DIAGNOSIS — M79601 Pain in right arm: Secondary | ICD-10-CM | POA: Diagnosis not present

## 2019-07-03 DIAGNOSIS — M7541 Impingement syndrome of right shoulder: Secondary | ICD-10-CM | POA: Diagnosis not present

## 2019-07-03 DIAGNOSIS — M25611 Stiffness of right shoulder, not elsewhere classified: Secondary | ICD-10-CM | POA: Diagnosis not present

## 2019-07-09 ENCOUNTER — Ambulatory Visit
Admission: RE | Admit: 2019-07-09 | Discharge: 2019-07-09 | Disposition: A | Discharge status: Home / Self Care | Payer: Medicare Other | Source: Ambulatory Visit | Attending: Internal Medicine | Admitting: Internal Medicine

## 2019-07-09 ENCOUNTER — Other Ambulatory Visit: Payer: Self-pay

## 2019-07-09 DIAGNOSIS — M85859 Other specified disorders of bone density and structure, unspecified thigh: Secondary | ICD-10-CM

## 2019-07-09 DIAGNOSIS — Z1231 Encounter for screening mammogram for malignant neoplasm of breast: Secondary | ICD-10-CM

## 2019-07-15 ENCOUNTER — Telehealth: Payer: Self-pay | Admitting: Podiatry

## 2019-07-15 MED ORDER — GABAPENTIN 100 MG PO CAPS: 100.0000 mg | ORAL_CAPSULE | Freq: Every day | ORAL | 1 refills | Status: DC

## 2019-07-15 NOTE — Telephone Encounter (Signed)
I informed pt the gabapentin had been sent to the [facility].

## 2019-07-15 NOTE — Telephone Encounter (Signed)
Pt called requesting a refill of her gabapentin 100mg . Pt states she is taking it at night and when going to therapy. Please send to [facility].

## 2019-07-15 NOTE — Addendum Note (Signed)
Addended by: Harriett Sine D on: 07/15/2019 04:49 PM   Modules accepted: Orders

## 2019-07-27 ENCOUNTER — Other Ambulatory Visit: Payer: Self-pay

## 2019-07-27 ENCOUNTER — Ambulatory Visit (INDEPENDENT_AMBULATORY_CARE_PROVIDER_SITE_OTHER): Payer: Medicare Other | Admitting: Podiatry

## 2019-07-27 DIAGNOSIS — Z969 Presence of functional implant, unspecified: Secondary | ICD-10-CM

## 2019-07-27 DIAGNOSIS — M79671 Pain in right foot: Secondary | ICD-10-CM | POA: Diagnosis not present

## 2019-07-27 DIAGNOSIS — R269 Unspecified abnormalities of gait and mobility: Secondary | ICD-10-CM | POA: Diagnosis not present

## 2019-07-27 DIAGNOSIS — M25474 Effusion, right foot: Secondary | ICD-10-CM | POA: Diagnosis not present

## 2019-07-27 DIAGNOSIS — M25674 Stiffness of right foot, not elsewhere classified: Secondary | ICD-10-CM | POA: Diagnosis not present

## 2019-07-27 DIAGNOSIS — M25671 Stiffness of right ankle, not elsewhere classified: Secondary | ICD-10-CM | POA: Diagnosis not present

## 2019-07-27 DIAGNOSIS — Z9889 Other specified postprocedural states: Secondary | ICD-10-CM

## 2019-07-28 DIAGNOSIS — M7541 Impingement syndrome of right shoulder: Secondary | ICD-10-CM | POA: Diagnosis not present

## 2019-07-28 DIAGNOSIS — M79601 Pain in right arm: Secondary | ICD-10-CM | POA: Diagnosis not present

## 2019-07-28 DIAGNOSIS — M25611 Stiffness of right shoulder, not elsewhere classified: Secondary | ICD-10-CM | POA: Diagnosis not present

## 2019-07-28 DIAGNOSIS — R29898 Other symptoms and signs involving the musculoskeletal system: Secondary | ICD-10-CM | POA: Diagnosis not present

## 2019-07-31 DIAGNOSIS — M25611 Stiffness of right shoulder, not elsewhere classified: Secondary | ICD-10-CM | POA: Diagnosis not present

## 2019-07-31 DIAGNOSIS — M79601 Pain in right arm: Secondary | ICD-10-CM | POA: Diagnosis not present

## 2019-07-31 DIAGNOSIS — M7541 Impingement syndrome of right shoulder: Secondary | ICD-10-CM | POA: Diagnosis not present

## 2019-07-31 DIAGNOSIS — R29898 Other symptoms and signs involving the musculoskeletal system: Secondary | ICD-10-CM | POA: Diagnosis not present

## 2019-08-03 DIAGNOSIS — R269 Unspecified abnormalities of gait and mobility: Secondary | ICD-10-CM | POA: Diagnosis not present

## 2019-08-03 DIAGNOSIS — M25674 Stiffness of right foot, not elsewhere classified: Secondary | ICD-10-CM | POA: Diagnosis not present

## 2019-08-03 DIAGNOSIS — M79671 Pain in right foot: Secondary | ICD-10-CM | POA: Diagnosis not present

## 2019-08-03 DIAGNOSIS — M25671 Stiffness of right ankle, not elsewhere classified: Secondary | ICD-10-CM | POA: Diagnosis not present

## 2019-08-03 DIAGNOSIS — M25474 Effusion, right foot: Secondary | ICD-10-CM | POA: Diagnosis not present

## 2019-08-04 DIAGNOSIS — M7541 Impingement syndrome of right shoulder: Secondary | ICD-10-CM | POA: Diagnosis not present

## 2019-08-04 DIAGNOSIS — M25611 Stiffness of right shoulder, not elsewhere classified: Secondary | ICD-10-CM | POA: Diagnosis not present

## 2019-08-04 DIAGNOSIS — R29898 Other symptoms and signs involving the musculoskeletal system: Secondary | ICD-10-CM | POA: Diagnosis not present

## 2019-08-04 DIAGNOSIS — M79601 Pain in right arm: Secondary | ICD-10-CM | POA: Diagnosis not present

## 2019-08-04 NOTE — Progress Notes (Signed)
Subjective: Alexandra Cain is a 74 y.o. is seen today in office s/p right foot removal of hardware preformed on 04/01/2019.  She still doing physical therapy she has been wearing a regular shoe.  She does feel that the therapy has been helpful.  She still getting numbness that is irritating to her first and second toes and she is on gabapentin which is helpful.  She does state that this is becoming more intermittent.  Still has some difficulty in flexing her toes.  She is also starting to get irritation in the left second toe on the nail border concern for ingrown toenail.  No drainage or pus.  Denies any systemic complaints such as fevers, chills, nausea, vomiting. No calf pain, chest pain, shortness of breath.   Objective: General: No acute distress, AAOx3  DP/PT pulses palpable 2/4, CRT < 3 sec to all digits.  Protective sensation intact. Motor function intact.  RIGHT foot: Incision is well coapted without any evidence of dehiscence and scar is well formed.  There is no surrounding erythema, ascending cellulitis.  No signs of infection.  She still has numbness on the hallux and second digit.  Mild improvement range of motion of the MPJs but still appears to be stiff with range of motion. LEFT: Incurvation present the lateral aspect the second toe with localized edema and erythema.  No drainage or pus.  No ascending cellulitis. No pain with calf compression, swelling, warmth, erythema.   Assessment and Plan:  Status post right foot removal of hardware, neuritis; left second toe ingrown toenail  -Treatment options discussed including all alternatives, risks, and complications -Debrided the symptomatic portion of the left second toenail with any complications.  Recommend Epson salt soaks daily as well as antibiotic ointment dressing.  Monitoring signs or symptoms of infection or if there is continued symptoms will need to have a partial nail avulsion. -Regards the right foot continue physical therapy and  continue range of motion exercises in a regular shoe.  Continue gabapentin.  Hopefully we can wean off this in the near future.  Return in about 6 weeks (around 09/07/2019).  Trula Slade DPM

## 2019-08-07 DIAGNOSIS — M79601 Pain in right arm: Secondary | ICD-10-CM | POA: Diagnosis not present

## 2019-08-07 DIAGNOSIS — M7541 Impingement syndrome of right shoulder: Secondary | ICD-10-CM | POA: Diagnosis not present

## 2019-08-07 DIAGNOSIS — M25611 Stiffness of right shoulder, not elsewhere classified: Secondary | ICD-10-CM | POA: Diagnosis not present

## 2019-08-07 DIAGNOSIS — R29898 Other symptoms and signs involving the musculoskeletal system: Secondary | ICD-10-CM | POA: Diagnosis not present

## 2019-08-13 ENCOUNTER — Telehealth: Payer: Self-pay | Admitting: Obstetrics & Gynecology

## 2019-08-13 NOTE — Telephone Encounter (Signed)
Per review of Epic, BMD at [facility] on 07/09/19, ordered by PCP.   Last AEX 08/29/18 Next AEX 01/01/20  Routing to Dr. Sabra Heck to review BMD

## 2019-08-13 NOTE — Telephone Encounter (Signed)
Patient called asking if Dr.Miller had reviewed her BMD results from 07/09/19?

## 2019-08-15 ENCOUNTER — Ambulatory Visit: Payer: Medicare Other | Attending: Internal Medicine

## 2019-08-15 DIAGNOSIS — Z23 Encounter for immunization: Secondary | ICD-10-CM | POA: Insufficient documentation

## 2019-08-15 NOTE — Progress Notes (Signed)
   Covid-19 Vaccination Clinic  Name:  AMEIRA BOOTZ    MRN: [id number] DOB: 12-Sep-1945  08/15/2019  Ms. Camberos was observed post Covid-19 immunization for 15 minutes without incidence. She was provided with Vaccine Information Sheet and instruction to access the V-Safe system.   Ms. Harne was instructed to call 911 with any severe reactions post vaccine: Marland Kitchen Difficulty breathing  . Swelling of your face and throat  . A fast heartbeat  . A bad rash all over your body  . Dizziness and weakness    Immunizations Administered    Name Date Dose VIS Date Route   Pfizer COVID-19 Vaccine 08/15/2019 12:27 PM 0.3 mL 07/03/2019 Intramuscular   Manufacturer: [place]   Lot: [id number]   [place]: [id number]

## 2019-08-17 ENCOUNTER — Other Ambulatory Visit: Payer: Medicare Other | Admitting: Orthotics

## 2019-08-17 DIAGNOSIS — R269 Unspecified abnormalities of gait and mobility: Secondary | ICD-10-CM | POA: Diagnosis not present

## 2019-08-17 DIAGNOSIS — M25474 Effusion, right foot: Secondary | ICD-10-CM | POA: Diagnosis not present

## 2019-08-17 DIAGNOSIS — M25671 Stiffness of right ankle, not elsewhere classified: Secondary | ICD-10-CM | POA: Diagnosis not present

## 2019-08-17 DIAGNOSIS — M25674 Stiffness of right foot, not elsewhere classified: Secondary | ICD-10-CM | POA: Diagnosis not present

## 2019-08-17 DIAGNOSIS — M79671 Pain in right foot: Secondary | ICD-10-CM | POA: Diagnosis not present

## 2019-08-17 NOTE — Telephone Encounter (Signed)
Spoke with patient, advised as seen below per Dr. Miller. Patient verbalizes understanding and is agreeable.   Encounter closed.  

## 2019-08-17 NOTE — Telephone Encounter (Signed)
Patient returning call to Jill. °

## 2019-08-17 NOTE — Telephone Encounter (Signed)
I did not review it because it wasn't sent to me (as I didn't order it).  However, I have now reviewed it and she has just osteopenia.  No osteoporosis present.  She is not high risk for fracture.  We can keep monitoring this.  I would repeat this in 3-4 years.  She can do it in two years but I don't think she needs to repeat it that soon.  Thanks.

## 2019-08-17 NOTE — Telephone Encounter (Signed)
Left message to call Kawthar Ennen, RN at [facility] [phone].   

## 2019-08-25 DIAGNOSIS — M79601 Pain in right arm: Secondary | ICD-10-CM | POA: Diagnosis not present

## 2019-08-25 DIAGNOSIS — M7541 Impingement syndrome of right shoulder: Secondary | ICD-10-CM | POA: Diagnosis not present

## 2019-08-25 DIAGNOSIS — R29898 Other symptoms and signs involving the musculoskeletal system: Secondary | ICD-10-CM | POA: Diagnosis not present

## 2019-08-25 DIAGNOSIS — M25611 Stiffness of right shoulder, not elsewhere classified: Secondary | ICD-10-CM | POA: Diagnosis not present

## 2019-08-27 ENCOUNTER — Other Ambulatory Visit: Payer: Medicare Other | Admitting: Orthotics

## 2019-08-28 DIAGNOSIS — M25611 Stiffness of right shoulder, not elsewhere classified: Secondary | ICD-10-CM | POA: Diagnosis not present

## 2019-08-28 DIAGNOSIS — R29898 Other symptoms and signs involving the musculoskeletal system: Secondary | ICD-10-CM | POA: Diagnosis not present

## 2019-08-28 DIAGNOSIS — M79601 Pain in right arm: Secondary | ICD-10-CM | POA: Diagnosis not present

## 2019-08-28 DIAGNOSIS — M7541 Impingement syndrome of right shoulder: Secondary | ICD-10-CM | POA: Diagnosis not present

## 2019-09-01 ENCOUNTER — Ambulatory Visit: Payer: Medicare Other

## 2019-09-01 DIAGNOSIS — M25611 Stiffness of right shoulder, not elsewhere classified: Secondary | ICD-10-CM | POA: Diagnosis not present

## 2019-09-01 DIAGNOSIS — R29898 Other symptoms and signs involving the musculoskeletal system: Secondary | ICD-10-CM | POA: Diagnosis not present

## 2019-09-01 DIAGNOSIS — M79601 Pain in right arm: Secondary | ICD-10-CM | POA: Diagnosis not present

## 2019-09-01 DIAGNOSIS — M7541 Impingement syndrome of right shoulder: Secondary | ICD-10-CM | POA: Diagnosis not present

## 2019-09-04 DIAGNOSIS — M7541 Impingement syndrome of right shoulder: Secondary | ICD-10-CM | POA: Diagnosis not present

## 2019-09-04 DIAGNOSIS — R29898 Other symptoms and signs involving the musculoskeletal system: Secondary | ICD-10-CM | POA: Diagnosis not present

## 2019-09-04 DIAGNOSIS — M25611 Stiffness of right shoulder, not elsewhere classified: Secondary | ICD-10-CM | POA: Diagnosis not present

## 2019-09-04 DIAGNOSIS — M79601 Pain in right arm: Secondary | ICD-10-CM | POA: Diagnosis not present

## 2019-09-05 ENCOUNTER — Ambulatory Visit: Payer: Medicare Other | Attending: Internal Medicine

## 2019-09-05 DIAGNOSIS — Z23 Encounter for immunization: Secondary | ICD-10-CM | POA: Insufficient documentation

## 2019-09-05 NOTE — Progress Notes (Signed)
   Covid-19 Vaccination Clinic  Name:  Alexandra Cain    MRN: [id number] DOB: 10-16-1945  09/05/2019  Alexandra Cain was observed post Covid-19 immunization for 15 minutes without incidence. She was provided with Vaccine Information Sheet and instruction to access the V-Safe system.   Alexandra Cain was instructed to call 911 with any severe reactions post vaccine: Marland Kitchen Difficulty breathing  . Swelling of your face and throat  . A fast heartbeat  . A bad rash all over your body  . Dizziness and weakness    Immunizations Administered    Name Date Dose VIS Date Route   Pfizer COVID-19 Vaccine 09/05/2019 11:14 AM 0.3 mL 07/03/2019 Intramuscular   Manufacturer: [place]   Lot: [id number]   [place]: [id number]

## 2019-09-07 ENCOUNTER — Ambulatory Visit: Payer: Medicare Other | Admitting: Podiatry

## 2019-09-11 ENCOUNTER — Telehealth: Payer: Self-pay | Admitting: *Deleted

## 2019-09-11 ENCOUNTER — Telehealth: Payer: Self-pay | Admitting: Obstetrics & Gynecology

## 2019-09-11 DIAGNOSIS — M7541 Impingement syndrome of right shoulder: Secondary | ICD-10-CM | POA: Diagnosis not present

## 2019-09-11 DIAGNOSIS — M25611 Stiffness of right shoulder, not elsewhere classified: Secondary | ICD-10-CM | POA: Diagnosis not present

## 2019-09-11 DIAGNOSIS — M79601 Pain in right arm: Secondary | ICD-10-CM | POA: Diagnosis not present

## 2019-09-11 DIAGNOSIS — R29898 Other symptoms and signs involving the musculoskeletal system: Secondary | ICD-10-CM | POA: Diagnosis not present

## 2019-09-11 NOTE — Telephone Encounter (Signed)
Spoke with patient. Patients AEX was r/s due to provider in OR. Patient is requesting earlier OV. Patient reports pea size lump just inside vaginal canal. She noticed it 5-6 days ago. Denies pain or vaginal bleeding. AEX r/s to 2/26 at 4pm with Dr. Sabra Heck.   Routing to provider for final review. Patient is agreeable to disposition. Will close encounter.

## 2019-09-11 NOTE — Telephone Encounter (Signed)
Left message regarding upcoming appointment has been canceled and needs to be rescheduled. °

## 2019-09-11 NOTE — Telephone Encounter (Signed)
Patient has a wart on vagina she would like check.

## 2019-09-15 ENCOUNTER — Ambulatory Visit: Payer: Medicare Other | Admitting: Obstetrics & Gynecology

## 2019-09-15 DIAGNOSIS — R29898 Other symptoms and signs involving the musculoskeletal system: Secondary | ICD-10-CM | POA: Diagnosis not present

## 2019-09-15 DIAGNOSIS — M7541 Impingement syndrome of right shoulder: Secondary | ICD-10-CM | POA: Diagnosis not present

## 2019-09-15 DIAGNOSIS — M79601 Pain in right arm: Secondary | ICD-10-CM | POA: Diagnosis not present

## 2019-09-15 DIAGNOSIS — M25611 Stiffness of right shoulder, not elsewhere classified: Secondary | ICD-10-CM | POA: Diagnosis not present

## 2019-09-17 ENCOUNTER — Other Ambulatory Visit: Payer: Self-pay

## 2019-09-18 ENCOUNTER — Encounter: Payer: Self-pay | Admitting: Obstetrics & Gynecology

## 2019-09-18 ENCOUNTER — Ambulatory Visit (INDEPENDENT_AMBULATORY_CARE_PROVIDER_SITE_OTHER): Payer: Medicare Other | Admitting: Obstetrics & Gynecology

## 2019-09-18 ENCOUNTER — Other Ambulatory Visit (HOSPITAL_COMMUNITY)
Admission: RE | Admit: 2019-09-18 | Discharge: 2019-09-18 | Disposition: A | Discharge status: Home / Self Care | Payer: Medicare Other | Source: Ambulatory Visit | Attending: Obstetrics & Gynecology | Admitting: Obstetrics & Gynecology

## 2019-09-18 VITALS — BP 126/68 | HR 92 | Temp 97.2°F | Resp 10 | Ht 62.0 in | Wt 124.8 lb

## 2019-09-18 DIAGNOSIS — Z01419 Encounter for gynecological examination (general) (routine) without abnormal findings: Secondary | ICD-10-CM

## 2019-09-18 DIAGNOSIS — R1031 Right lower quadrant pain: Secondary | ICD-10-CM | POA: Diagnosis not present

## 2019-09-18 DIAGNOSIS — R1909 Other intra-abdominal and pelvic swelling, mass and lump: Secondary | ICD-10-CM

## 2019-09-18 DIAGNOSIS — M79601 Pain in right arm: Secondary | ICD-10-CM | POA: Diagnosis not present

## 2019-09-18 DIAGNOSIS — R8290 Unspecified abnormal findings in urine: Secondary | ICD-10-CM | POA: Diagnosis not present

## 2019-09-18 DIAGNOSIS — Z124 Encounter for screening for malignant neoplasm of cervix: Secondary | ICD-10-CM | POA: Insufficient documentation

## 2019-09-18 DIAGNOSIS — M25611 Stiffness of right shoulder, not elsewhere classified: Secondary | ICD-10-CM | POA: Diagnosis not present

## 2019-09-18 DIAGNOSIS — M7541 Impingement syndrome of right shoulder: Secondary | ICD-10-CM | POA: Diagnosis not present

## 2019-09-18 LAB — POCT URINALYSIS DIPSTICK
Bilirubin, UA: NEGATIVE
Blood, UA: NEGATIVE
Glucose, UA: NEGATIVE
Ketones, UA: NEGATIVE
Leukocytes, UA: NEGATIVE
Nitrite, UA: NEGATIVE
Odor: POSITIVE
Protein, UA: NEGATIVE
Urobilinogen, UA: 0.2 E.U./dL
pH, UA: 5 (ref 5.0–8.0)

## 2019-09-18 MED ORDER — MEDROXYPROGESTERONE ACETATE 2.5 MG PO TABS: 2.5000 mg | ORAL_TABLET | Freq: Every day | ORAL | 4 refills | Status: DC

## 2019-09-18 MED ORDER — ESTRADIOL 0.5 MG PO TABS: 0.5000 mg | ORAL_TABLET | Freq: Every day | ORAL | 4 refills | Status: DC

## 2019-09-18 NOTE — Progress Notes (Signed)
74 y.o. G0P0 Married White or Caucasian female here for annual exam.  Has a left frozen shoulder.  Has been doing PT since September.  She's still doing twice weekly PT.    Denies vaginal bleeding.    Tried to decrease her HRT in half last year.  Could not tolerate the hot flashes/night sweats.  This was the second time .  She is clearly aware of risks of stroke or breast cancer.    Patient's last menstrual period was 07/24/1991.          Sexually active: Yes.    The current method of family planning is post menopausal status.    Exercising: Yes.    walking Smoker:  no  Health Maintenance: Pap:  06/21/17 Neg              12/06/14 Neg  History of abnormal Pap:  no MMG:  07/09/19 BIRADS 1 negative/density c Colonoscopy:  08/26/18 Normal BMD:   07/09/19 Osteopenia TDaP:  2016 Pneumonia vaccine(s):  done Shingrix:   completed Hep C testing: 04/30/16 Neg Screening Labs: PCP   reports that she has never smoked. She has never used smokeless tobacco. She reports that she does not drink alcohol or use drugs.  Past Medical History:  Diagnosis Date  . Endometriosis 3/96   bx neg, suspicious for endo  . Frozen shoulder 2010  . Infertility, female   . Menopause   . Ulcer     Past Surgical History:  Procedure Laterality Date  . APPENDECTOMY    . AUGMENTATION MAMMAPLASTY  1975  . CYST EXCISION Left 04/2015   ear   . CYST EXCISION Left 09/2015   Shoulder  . DILATION AND CURETTAGE OF UTERUS  1993  . EYE SURGERY  11/02/14  . FOOT SURGERY    . OTHER SURGICAL HISTORY  3/07   excision fo cx granuloma tissue under anesthesia  . PELVIC LAPAROSCOPY  3/96   due to pelvic pain  . TONSILLECTOMY AND ADENOIDECTOMY      Current Outpatient Medications  Medication Sig Dispense Refill  . Ascorbic Acid (VITAMIN C PO) Take by mouth.    . cholecalciferol (VITAMIN D) 1000 UNITS tablet Take 1,000 Units by mouth daily.    . Coenzyme Q10 (CO Q 10) 100 MG CAPS Take by mouth daily.    Marland Kitchen estradiol  (ESTRACE) 0.5 MG tablet Take 1 tablet (0.5 mg total) by mouth daily. 90 tablet 4  . fluticasone (FLONASE) 50 MCG/ACT nasal spray Place 1 spray into both nostrils 2 (two) times a day.     . gabapentin (NEURONTIN) 100 MG capsule Take 1 capsule (100 mg total) by mouth at bedtime. 30 capsule 1  . medroxyPROGESTERone (PROVERA) 2.5 MG tablet Take 1 tablet (2.5 mg total) by mouth daily. 90 tablet 4  . Multiple Vitamin (MULTIVITAMIN) tablet Take 1 tablet by mouth daily.    . Multiple Vitamins-Minerals (PRESERVISION AREDS PO) Take 2 tablets by mouth daily.    . rosuvastatin (CRESTOR) 5 MG tablet Take 1 tablet by mouth daily.     No current facility-administered medications for this visit.    Family History  Problem Relation Age of Onset  . Dementia Mother   . Breast cancer Paternal Grandmother     Review of Systems  Genitourinary:       Vaginal lump Odor in urine   All other systems reviewed and are negative.   Exam:   BP 126/68 (BP Location: Right Arm, Patient Position: Sitting, Cuff Size:  Normal)   Pulse 92   Temp (!) 97.2 F (36.2 C) (Temporal)   Resp 10   Ht 5\' 2"  (1.575 m)   Wt 124 lb 12.8 oz (56.6 kg)   LMP 07/24/1991   BMI 22.83 kg/m   Height: 5\' 2"  (157.5 cm)  Ht Readings from Last 3 Encounters:  09/18/19 5\' 2"  (1.575 m)  09/04/18 5' 2.25" (1.581 m)  08/29/18 5' 2.25" (1.581 m)    General appearance: alert, cooperative and appears stated age Head: Normocephalic, without obvious abnormality, atraumatic Neck: no adenopathy, supple, symmetrical, trachea midline and thyroid normal to inspection and palpation Lungs: clear to auscultation bilaterally Breasts: normal appearance, no masses or tenderness, bilateral breast implants present Heart: regular rate and rhythm Abdomen: soft, non-tender; bowel sounds normal; no masses,  no organomegaly Extremities: extremities normal, atraumatic, no cyanosis or edema Skin: Skin color, texture, turgor normal. No rashes or lesions Lymph  nodes: Cervical, supraclavicular, and axillary nodes normal. No abnormal inguinal nodes palpated Neurologic: Grossly normal   Pelvic: External genitalia:  No lesions but there is a single right labia majora sebaceous cyst              Urethra:  normal appearing urethra with no masses, tenderness or lesions              Bartholins and Skenes: normal                 Vagina: normal appearing vagina, narrow apex with scarring of vagina to cervix              Cervix: no lesions              Pap taken: Yes.   Bimanual Exam:  Uterus:  normal size, contour, position, consistency, mobility, non-tender              Adnexa: normal adnexa and no mass, fullness, tenderness               Rectovaginal: Confirms               Anus:  normal sphincter tone, no lesions  Chaperone, Terence Lux, CMA, was present for exam.  A:  Well Woman with normal exam PMP, no HRT Grade C breast density with bilateral implants Chronic RLQ pain.  Has seen Dr. Cristina Gong Abnormal urine odor  P:   Mammogram guidelines reviewed.  Doing yearly. pap smear obtained today.  This will be the last one.  Pt comfortable with this plan Ca-125 obtained.  Pt requests yearly and understands the weaknesses in this test. Urine culture pending Estrace 0.5mg  daily and provera 2.5mg  daily.  Rx to pharmacy for year.  Pt has tried to wean twice and has been unsuccessful getting to a lower dose.  Breast cancer and stroke/VTE risks reviewed.  She knows she will HAVE to stop if any of the above every occur.  Pt desires to continue until that time Colonoscopy and BMD UTD Return for AEX 1 year or prn

## 2019-09-19 LAB — CA 125: Cancer Antigen (CA) 125: 21 U/mL (ref 0.0–38.1)

## 2019-09-20 LAB — URINE CULTURE

## 2019-09-21 LAB — CYTOLOGY - PAP: Diagnosis: NEGATIVE

## 2019-09-22 ENCOUNTER — Ambulatory Visit (INDEPENDENT_AMBULATORY_CARE_PROVIDER_SITE_OTHER): Payer: Medicare Other | Admitting: Podiatry

## 2019-09-22 ENCOUNTER — Encounter: Payer: Self-pay | Admitting: Podiatry

## 2019-09-22 ENCOUNTER — Other Ambulatory Visit: Payer: Self-pay

## 2019-09-22 ENCOUNTER — Ambulatory Visit: Payer: Medicare Other

## 2019-09-22 DIAGNOSIS — M79671 Pain in right foot: Secondary | ICD-10-CM

## 2019-09-22 DIAGNOSIS — G8929 Other chronic pain: Secondary | ICD-10-CM | POA: Diagnosis not present

## 2019-09-22 DIAGNOSIS — G5791 Unspecified mononeuropathy of right lower limb: Secondary | ICD-10-CM

## 2019-09-22 DIAGNOSIS — L600 Ingrowing nail: Secondary | ICD-10-CM

## 2019-09-25 DIAGNOSIS — M79601 Pain in right arm: Secondary | ICD-10-CM | POA: Diagnosis not present

## 2019-09-25 DIAGNOSIS — R29898 Other symptoms and signs involving the musculoskeletal system: Secondary | ICD-10-CM | POA: Diagnosis not present

## 2019-09-25 DIAGNOSIS — M7541 Impingement syndrome of right shoulder: Secondary | ICD-10-CM | POA: Diagnosis not present

## 2019-09-25 DIAGNOSIS — M25611 Stiffness of right shoulder, not elsewhere classified: Secondary | ICD-10-CM | POA: Diagnosis not present

## 2019-09-25 NOTE — Progress Notes (Signed)
Subjective: Alexandra Cain is a 74 y.o. is seen today in office s/p right foot removal of hardware preformed on 04/01/2019.  Overall she states that she is soft and discomfort but the tenderness is moving.  Also the nerve pain she states moves around her foot.  Is not localized to one area.  She is doing therapy at home she still walking and trying to stay active.  She states that "I can live with this".  No recent injury or falls.  Her main concern today is tenderness of both of her second toes which are becoming ingrown.  No redness or drainage or any swelling. Denies any systemic complaints such as fevers, chills, nausea, vomiting. No calf pain, chest pain, shortness of breath.   Objective: General: No acute distress, AAOx3  DP/PT pulses palpable 2/4, CRT < 3 sec to all digits.  Protective sensation intact. Motor function intact.  RIGHT foot: Incision is well coapted without any evidence of dehiscence and scar is well formed.  There is no significant discomfort identified today.  Subjectively she reports pain sometimes on the incision, medial aspect of foot as well as lateral to the foot unable to identify any significant tenderness today.  There is no significant edema there is no erythema or warmth.  Normal skin temperature bilaterally.  She does show me pictures with the foot becomes red at times particularly with showers. Incurvation present to the lateral nail borders bilateral second toes without any drainage or pus.  Minimal edema there is no signs of an abscess or infection. No pain with calf compression, swelling, warmth, erythema.   Assessment and Plan:  Status post right foot removal of hardware, neuritis; left second toe ingrown toenail  -Treatment options discussed including all alternatives, risks, and complications -From a foot standpoint she seems to be better but she started some intermittent discomfort.  She is able to walk and do daily activities.  Orthotics are not comfortable and we  are going to send them back to add a thicker cushion to see if this will be helpful.  I discussed with her possible CRPS and discussed symptoms of this and she states that the foot is not that bad and is tolerable.  I discussed pain management referral but she wants to hold off on this.  Continues to be active.  She is been on gabapentin but she is weaning off of this.  She did this about once every 4 days. -Debride the nails x2 without any complications or bleeding.  Discussed partial nail avulsion with phenol.  She wants to hold off on this.  Return in about 3 months (around 12/23/2019) for ingrown toenails.  She feels that she is doing well and I will see her back as needed for the foot pain.  Trula Slade DPM

## 2019-09-29 DIAGNOSIS — M79601 Pain in right arm: Secondary | ICD-10-CM | POA: Diagnosis not present

## 2019-09-29 DIAGNOSIS — M7541 Impingement syndrome of right shoulder: Secondary | ICD-10-CM | POA: Diagnosis not present

## 2019-09-29 DIAGNOSIS — M25611 Stiffness of right shoulder, not elsewhere classified: Secondary | ICD-10-CM | POA: Diagnosis not present

## 2019-10-05 ENCOUNTER — Ambulatory Visit: Payer: Medicare Other | Admitting: Obstetrics & Gynecology

## 2019-10-06 DIAGNOSIS — R29898 Other symptoms and signs involving the musculoskeletal system: Secondary | ICD-10-CM | POA: Diagnosis not present

## 2019-10-06 DIAGNOSIS — M25611 Stiffness of right shoulder, not elsewhere classified: Secondary | ICD-10-CM | POA: Diagnosis not present

## 2019-10-06 DIAGNOSIS — M79601 Pain in right arm: Secondary | ICD-10-CM | POA: Diagnosis not present

## 2019-10-06 DIAGNOSIS — M7541 Impingement syndrome of right shoulder: Secondary | ICD-10-CM | POA: Diagnosis not present

## 2019-10-13 DIAGNOSIS — R29898 Other symptoms and signs involving the musculoskeletal system: Secondary | ICD-10-CM | POA: Diagnosis not present

## 2019-10-13 DIAGNOSIS — M79601 Pain in right arm: Secondary | ICD-10-CM | POA: Diagnosis not present

## 2019-10-13 DIAGNOSIS — M25611 Stiffness of right shoulder, not elsewhere classified: Secondary | ICD-10-CM | POA: Diagnosis not present

## 2019-10-13 DIAGNOSIS — M7541 Impingement syndrome of right shoulder: Secondary | ICD-10-CM | POA: Diagnosis not present

## 2019-10-20 DIAGNOSIS — M7541 Impingement syndrome of right shoulder: Secondary | ICD-10-CM | POA: Diagnosis not present

## 2019-10-20 DIAGNOSIS — M25611 Stiffness of right shoulder, not elsewhere classified: Secondary | ICD-10-CM | POA: Diagnosis not present

## 2019-10-20 DIAGNOSIS — R29898 Other symptoms and signs involving the musculoskeletal system: Secondary | ICD-10-CM | POA: Diagnosis not present

## 2019-10-20 DIAGNOSIS — M79601 Pain in right arm: Secondary | ICD-10-CM | POA: Diagnosis not present

## 2019-10-26 ENCOUNTER — Ambulatory Visit: Payer: Medicare Other | Admitting: Orthotics

## 2019-10-26 ENCOUNTER — Other Ambulatory Visit: Payer: Self-pay

## 2019-10-26 DIAGNOSIS — L600 Ingrowing nail: Secondary | ICD-10-CM

## 2019-10-26 DIAGNOSIS — Z9889 Other specified postprocedural states: Secondary | ICD-10-CM

## 2019-10-26 DIAGNOSIS — G5791 Unspecified mononeuropathy of right lower limb: Secondary | ICD-10-CM

## 2019-10-26 DIAGNOSIS — G8929 Other chronic pain: Secondary | ICD-10-CM

## 2019-10-26 NOTE — Progress Notes (Signed)
Patient came in today to pick up custom made foot orthotics.  The goals were accomplished and the patient reported no dissatisfaction with said orthotics.  Patient was advised of breakin period and how to report any issues. 

## 2019-10-27 DIAGNOSIS — M7541 Impingement syndrome of right shoulder: Secondary | ICD-10-CM | POA: Diagnosis not present

## 2019-10-27 DIAGNOSIS — M25611 Stiffness of right shoulder, not elsewhere classified: Secondary | ICD-10-CM | POA: Diagnosis not present

## 2019-10-27 DIAGNOSIS — M79601 Pain in right arm: Secondary | ICD-10-CM | POA: Diagnosis not present

## 2019-10-27 DIAGNOSIS — R29898 Other symptoms and signs involving the musculoskeletal system: Secondary | ICD-10-CM | POA: Diagnosis not present

## 2019-10-29 ENCOUNTER — Other Ambulatory Visit: Payer: Medicare Other | Admitting: Orthotics

## 2019-11-03 DIAGNOSIS — R140 Abdominal distension (gaseous): Secondary | ICD-10-CM | POA: Diagnosis not present

## 2019-11-03 DIAGNOSIS — M7541 Impingement syndrome of right shoulder: Secondary | ICD-10-CM | POA: Diagnosis not present

## 2019-11-03 DIAGNOSIS — M79601 Pain in right arm: Secondary | ICD-10-CM | POA: Diagnosis not present

## 2019-11-03 DIAGNOSIS — M25611 Stiffness of right shoulder, not elsewhere classified: Secondary | ICD-10-CM | POA: Diagnosis not present

## 2019-11-05 DIAGNOSIS — H0014 Chalazion left upper eyelid: Secondary | ICD-10-CM | POA: Diagnosis not present

## 2019-11-19 ENCOUNTER — Other Ambulatory Visit: Payer: Self-pay | Admitting: Podiatry

## 2019-11-19 NOTE — Telephone Encounter (Signed)
I reviewed Dr. Leigh Aurora last 09/22/2019 clinical note and pt was weaning from the gabapentin. I spoke with pt and she states she is continuing the Gabapentin at night because she wakes with a terrible burning in the foot. I encouraged pt to discuss with Dr. Jacqualyn Posey at her next appt.

## 2019-12-03 DIAGNOSIS — H04123 Dry eye syndrome of bilateral lacrimal glands: Secondary | ICD-10-CM | POA: Diagnosis not present

## 2019-12-10 ENCOUNTER — Ambulatory Visit (INDEPENDENT_AMBULATORY_CARE_PROVIDER_SITE_OTHER): Payer: Medicare Other

## 2019-12-10 ENCOUNTER — Encounter: Payer: Self-pay | Admitting: Podiatry

## 2019-12-10 ENCOUNTER — Ambulatory Visit (INDEPENDENT_AMBULATORY_CARE_PROVIDER_SITE_OTHER): Payer: Medicare Other | Admitting: Podiatry

## 2019-12-10 ENCOUNTER — Other Ambulatory Visit: Payer: Self-pay

## 2019-12-10 ENCOUNTER — Other Ambulatory Visit: Payer: Self-pay | Admitting: Podiatry

## 2019-12-10 DIAGNOSIS — M84374A Stress fracture, right foot, initial encounter for fracture: Secondary | ICD-10-CM

## 2019-12-10 DIAGNOSIS — R52 Pain, unspecified: Secondary | ICD-10-CM | POA: Diagnosis not present

## 2019-12-10 MED ORDER — GABAPENTIN 100 MG PO CAPS: 100.0000 mg | ORAL_CAPSULE | Freq: Three times a day (TID) | ORAL | 3 refills | Status: DC

## 2019-12-14 NOTE — Progress Notes (Signed)
Subjective:   Patient ID: Alexandra Cain, female   DOB: 74 y.o.   MRN: [id number]   HPI Patient presents stating that recently she has started developed swelling and pain in her forefoot right and it seems new and she does not remember specific injury.  Patient does not remember any other pathology currently   ROS      Objective:  Physical Exam  Neurovascular status intact with incision dorsal right foot which is healed well with the pain that is more distal to the area that she had had previous problems with.  No other pathology was noted with mild discomfort the lesser MPJs     Assessment:  Inflammatory capsuliti with possibility for stress fracture of the lesser second metatarsal right    Plan:  Reviewed stress fracture and the fact these can occur spontaneously and I do not think it was due to his previous surgery of approximate 8 months ago.  Reviewed her x-rays advised on wearing her boot surgical shoe and reappoint to recheck 3 weeks  X-rays indicate suspicion for stress fracture second metatarsal right which will be followed as we move forward

## 2019-12-24 ENCOUNTER — Ambulatory Visit: Payer: Medicare Other | Admitting: Podiatry

## 2019-12-24 DIAGNOSIS — S92324D Nondisplaced fracture of second metatarsal bone, right foot, subsequent encounter for fracture with routine healing: Secondary | ICD-10-CM | POA: Diagnosis not present

## 2019-12-24 DIAGNOSIS — M85859 Other specified disorders of bone density and structure, unspecified thigh: Secondary | ICD-10-CM | POA: Diagnosis not present

## 2019-12-28 ENCOUNTER — Telehealth: Payer: Self-pay | Admitting: *Deleted

## 2019-12-28 NOTE — Telephone Encounter (Signed)
I spoke with pt and she states she is not able to go into the surgical shoe and is back in the boot, in the surgical shoe she feels like she is walking on a broken foot. I told pt to stay in the surgical boot until seen in office 01/01/2020 and to bring the surgical shoe for our office staff to make sure she has a good fit and it is in good condition.

## 2019-12-28 NOTE — Telephone Encounter (Signed)
Pt states she saw Dr. Paulla Dolly 3 weeks ago, and was told she should start into a shoe a week ago, but she is unable to take even one step without the same pain she had before coming in.

## 2019-12-31 ENCOUNTER — Encounter: Payer: Self-pay | Admitting: Podiatry

## 2019-12-31 ENCOUNTER — Ambulatory Visit (INDEPENDENT_AMBULATORY_CARE_PROVIDER_SITE_OTHER): Payer: Medicare Other | Admitting: Podiatry

## 2019-12-31 ENCOUNTER — Other Ambulatory Visit: Payer: Self-pay

## 2019-12-31 ENCOUNTER — Ambulatory Visit (INDEPENDENT_AMBULATORY_CARE_PROVIDER_SITE_OTHER): Payer: Medicare Other

## 2019-12-31 VITALS — Temp 97.5°F

## 2019-12-31 DIAGNOSIS — M84374D Stress fracture, right foot, subsequent encounter for fracture with routine healing: Secondary | ICD-10-CM

## 2020-01-01 ENCOUNTER — Telehealth: Payer: Self-pay | Admitting: Podiatry

## 2020-01-01 ENCOUNTER — Telehealth: Payer: Self-pay | Admitting: *Deleted

## 2020-01-01 ENCOUNTER — Ambulatory Visit: Payer: Medicare Other | Admitting: Obstetrics & Gynecology

## 2020-01-01 NOTE — Progress Notes (Signed)
Subjective:   Patient ID: Marcelle Smiling, female   DOB: 74 y.o.   MRN: [id number]   HPI Patient states her foot is aching all the time but overall it is doing good and there is some discomfort within the arch but so far she is satisfied with where she is.  States the boot is helping her to walk and she is icing and elevating her foot   ROS      Objective:  Physical Exam  Neurovascular status intact with patient having had fracture dorsal second metatarsal with pinpoint discomfort still within the proximal shaft of the second metatarsal bone with reduced edema     Assessment:  Overall improving with stress fracture right which is currently appearing to show normal progression     Plan:  H&P reviewed condition discussed the continuation of immobilization but very quickly reducing the immobilization necessary.  Patient had numerous questions which I answered and I did discuss the fact that this fracture will probably take around another 8 weeks to heal.  I discussed topical medicine ice therapy and oral anti-inflammatories along with different modalities for immobilization she can utilize  X-rays indicate that there is a fracture of the proximal portion of the metatarsal it appears to be mostly with inside the cortex and does not appear to have a lot of movement with patient having moderate to significant osteoporosis that I reviewed with her

## 2020-01-01 NOTE — Telephone Encounter (Signed)
Pt called and stated that Dr.Regal wanted her to keep using the exigen bone healing system, pt stated that she was unable to because it had expired she now needs a actual written script sent to fax # [phone] so she can start using the machine

## 2020-01-01 NOTE — Telephone Encounter (Signed)
Returned patient's call regarding the Mason she has been using. Patient had seen Dr.Regal on 12/31/19 and told him that It had expired and she now needs a written prescription to use again.  I talked to Dr. Paulla Dolly today and he said that if her system was deactivated that she is not eligible to start again. He also thought that her insurance would not cover this. Dr. Paulla Dolly did not think this was needed for her to do.  When I told Ms. Spies about what Dr. Paulla Dolly said, she stated, "My insurance will cover this". She also stated, "Dr. Paulla Dolly told me that I could do the Campbell and that it would not hurt anything to try doing that."  Patient asked me to write on a prescription pad the following"  "Toniya Rozar Needs to continue to use Exogen Device"  She asked me to fax this to:  [facility] Fax # [phone]  Phone number: [phone]  I faxed over to [facility] the prescription

## 2020-01-05 ENCOUNTER — Telehealth: Payer: Self-pay | Admitting: Podiatry

## 2020-01-05 NOTE — Telephone Encounter (Signed)
Pt called and wanted to thank you for getting the script for her machine now pt wants to know if she needs to wear a compression sock please advise

## 2020-01-30 IMAGING — MG DIGITAL SCREENING BREAST BILAT IMPLANT W/ TOMO W/ CAD
8 of 14 series · 8 of 34 positions shown · non-contrast
Comparison: Previous exam(s).

CLINICAL DATA: Screening.

EXAM:
DIGITAL SCREENING BILATERAL MAMMOGRAM WITH IMPLANTS, CAD AND TOMO
The patient has retroglandular implants. Standard and implant
displaced views were performed.

[L CC]
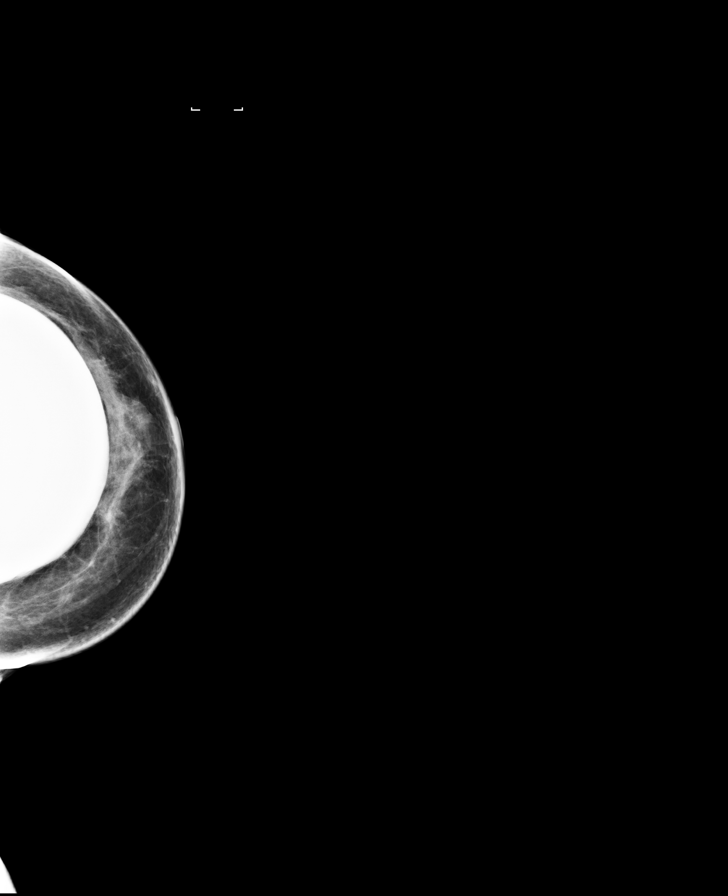

[L MLO]
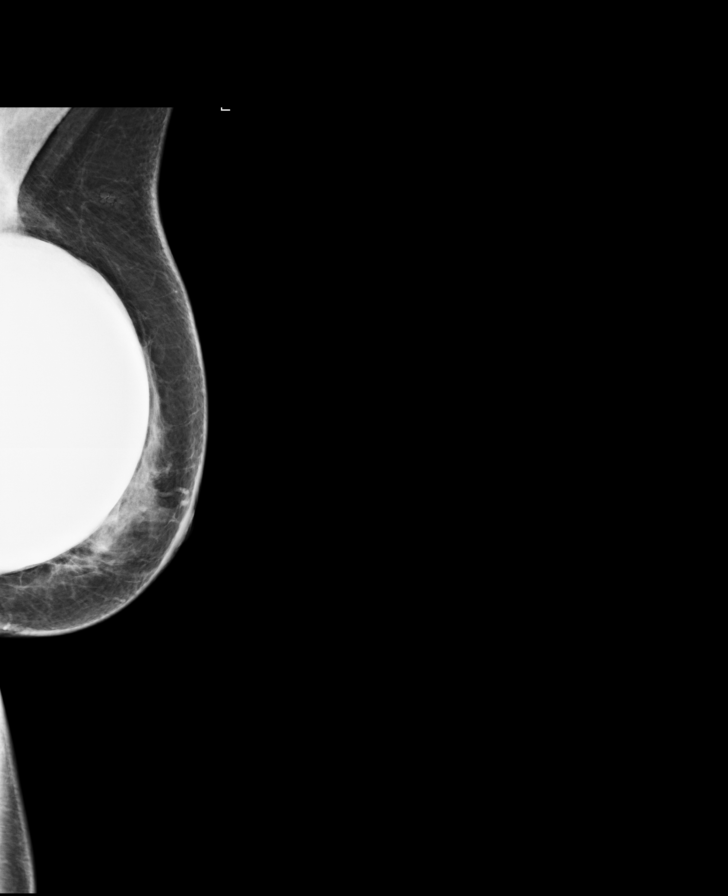

[R CC]
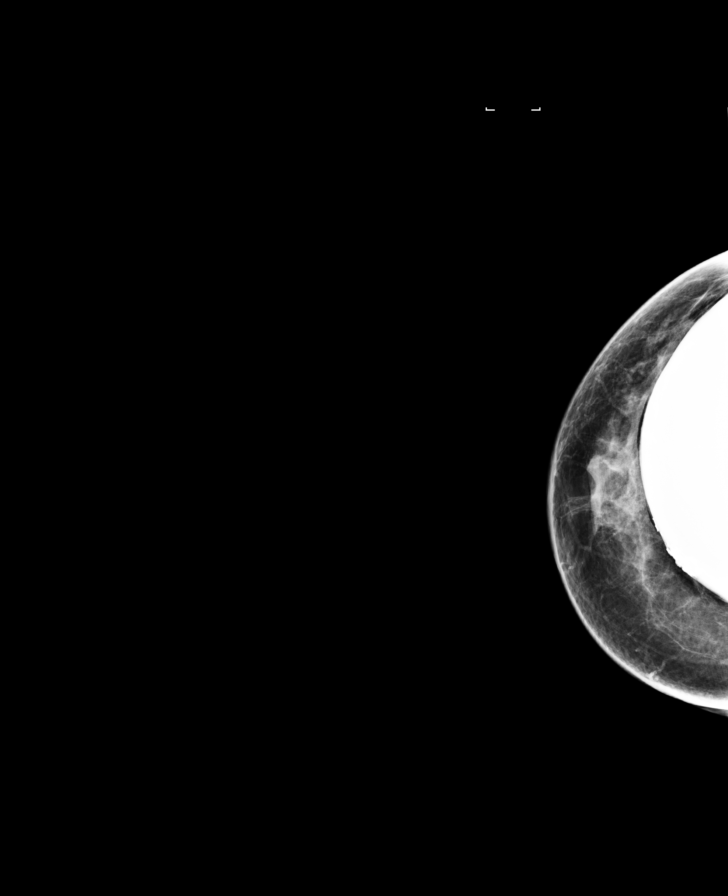

[R MLO]
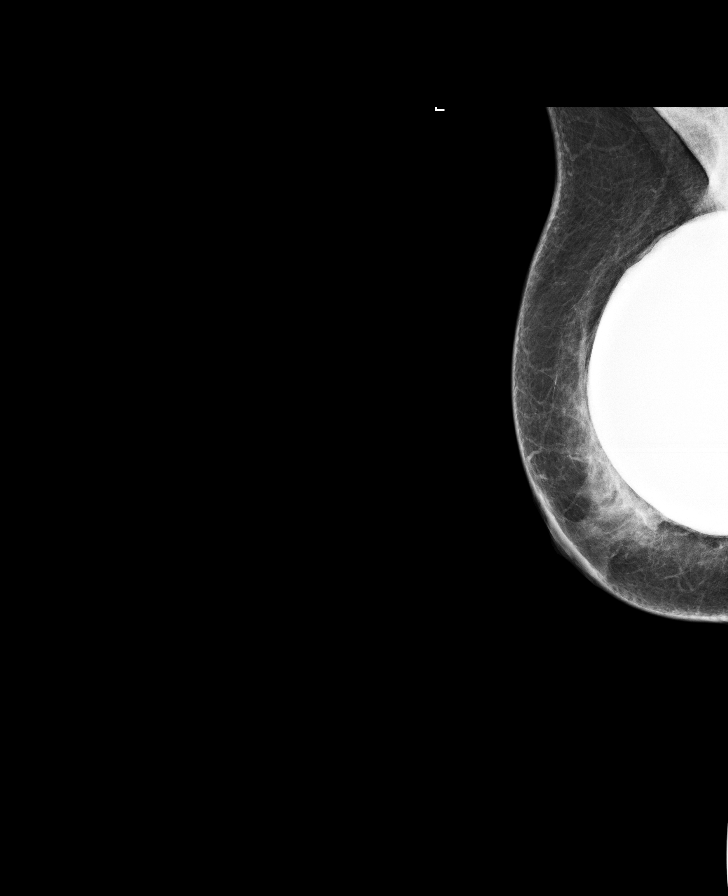

[L CC synth-2D (1 of 2)]
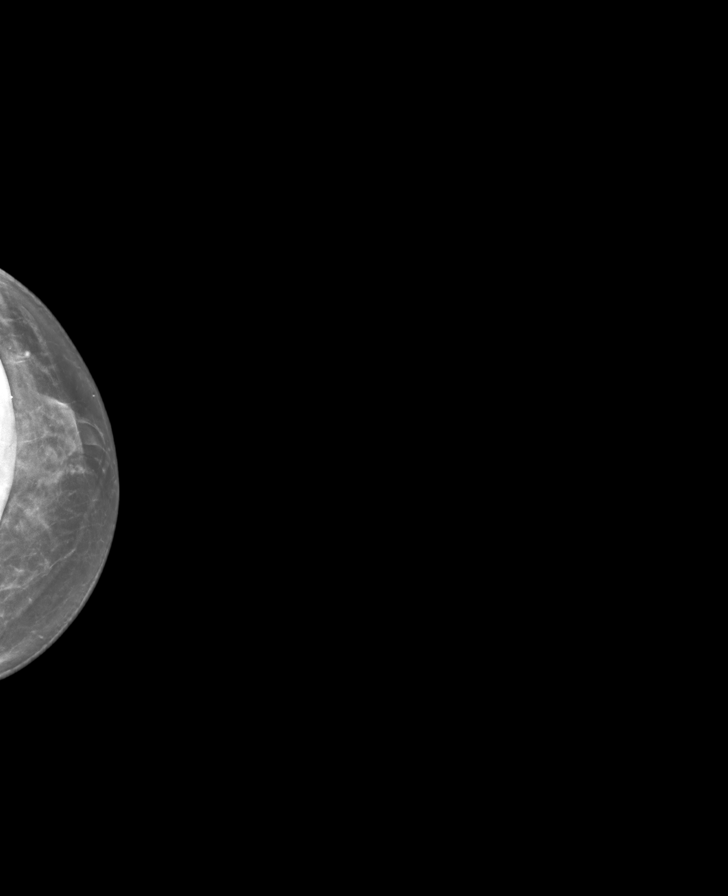

[R CC synth-2D]
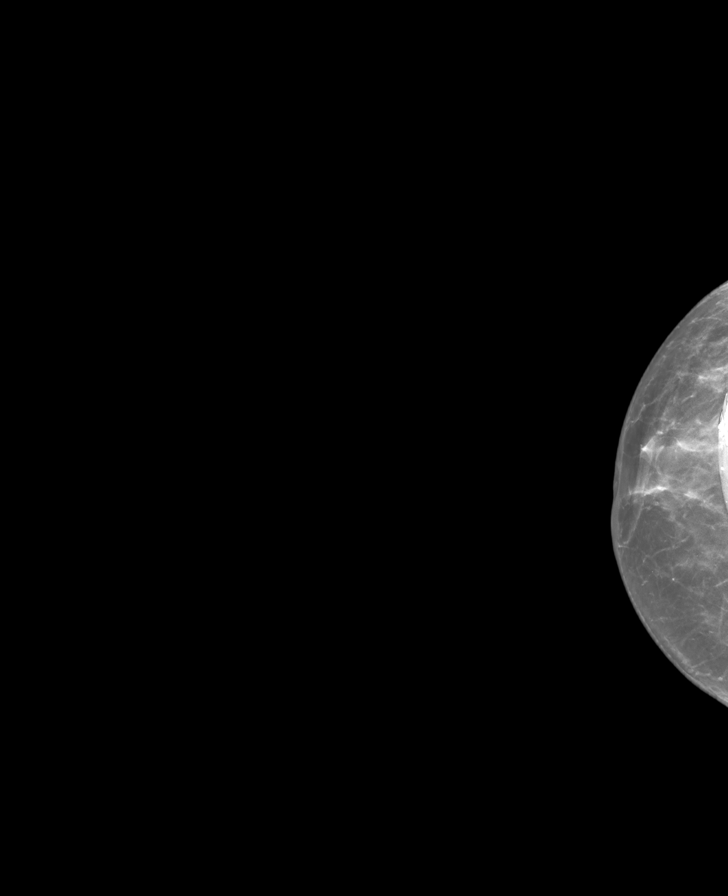

[L CC synth-2D (2 of 2)]
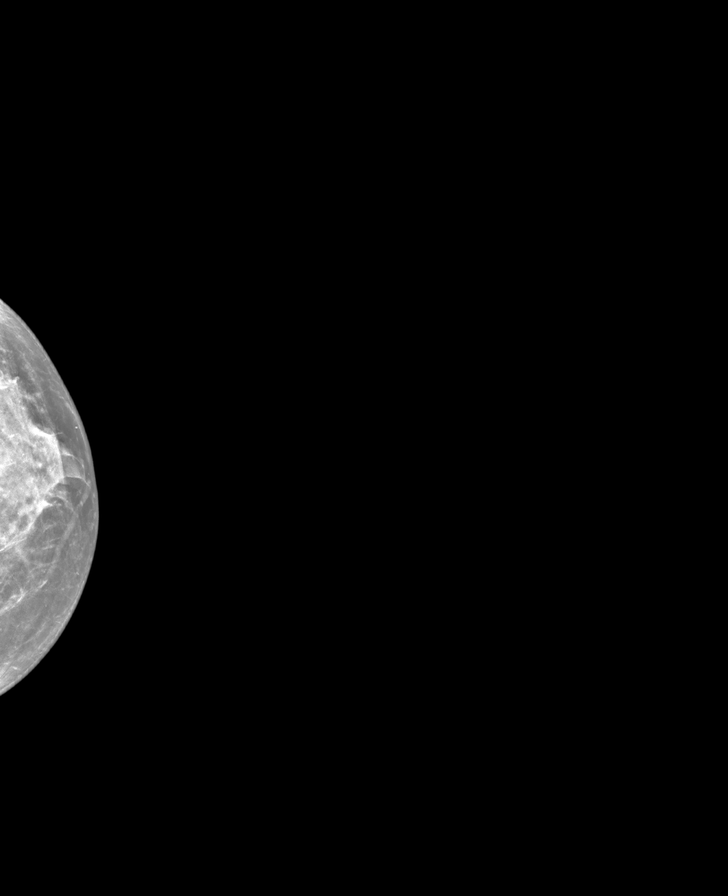

[L MLO synth-2D]
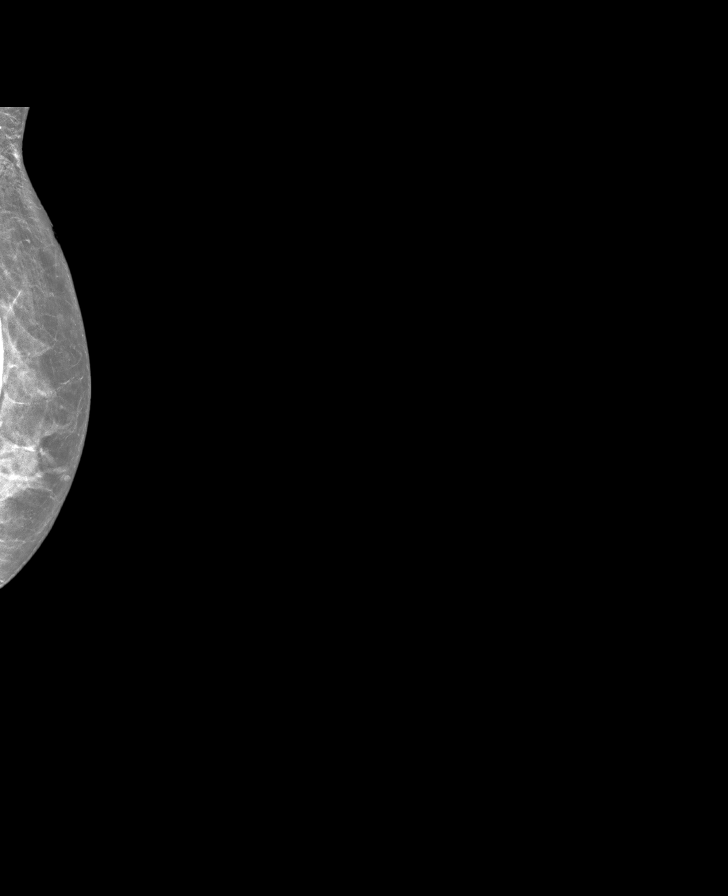

[8 of 34 positions shown; findings below may reference images not displayed]

ACR Breast Density Category c: The breast tissue is heterogeneously
dense, which may obscure small masses.
FINDINGS: There are no findings suspicious for malignancy. Images were
processed with CAD.
IMPRESSION: No mammographic evidence of malignancy. A result letter of this
screening mammogram will be mailed directly to the patient.

RECOMMENDATION:
Screening mammogram in one year. (Code:57-0-9UK)

BI-RADS CATEGORY  1:  Negative.

## 2020-02-09 DIAGNOSIS — L720 Epidermal cyst: Secondary | ICD-10-CM | POA: Diagnosis not present

## 2020-02-09 DIAGNOSIS — L821 Other seborrheic keratosis: Secondary | ICD-10-CM | POA: Diagnosis not present

## 2020-02-09 DIAGNOSIS — L570 Actinic keratosis: Secondary | ICD-10-CM | POA: Diagnosis not present

## 2020-02-09 DIAGNOSIS — D1801 Hemangioma of skin and subcutaneous tissue: Secondary | ICD-10-CM | POA: Diagnosis not present

## 2020-02-09 DIAGNOSIS — L820 Inflamed seborrheic keratosis: Secondary | ICD-10-CM | POA: Diagnosis not present

## 2020-03-02 ENCOUNTER — Telehealth: Payer: Self-pay | Admitting: Obstetrics & Gynecology

## 2020-03-02 NOTE — Telephone Encounter (Signed)
Patient would like to speak with nurse about getting a prescription for hemorrhoids.

## 2020-03-02 NOTE — Telephone Encounter (Signed)
Spoke with patient. Patient is requesting a refill of Anusol 2.5% rectal cream for hemorrhoids. Rx last sent 04/30/16, 30 g/1RF. Refill has expired.   Reports discomfort from hemorrhoid over the past 2 days, has tried OTC rectal suppository, not effective. Denies any rectal bleeding or severe pain. Has used the anusol rectal cream in the past and was effective.   Last AEX 09/18/19 w/ Dr. Sabra Heck.   Advise I will have to review request with covering provider and f/u, patient agreeable.  Confirmed pharmacy on file.  Rx pended for Anusol 2.5% rectal cream. Place 1 application rectally N1S. #30g/0RF  Routing to Dr. Talbert Nan to review request.

## 2020-03-03 DIAGNOSIS — H903 Sensorineural hearing loss, bilateral: Secondary | ICD-10-CM | POA: Diagnosis not present

## 2020-03-03 DIAGNOSIS — H6123 Impacted cerumen, bilateral: Secondary | ICD-10-CM | POA: Diagnosis not present

## 2020-03-03 DIAGNOSIS — H608X3 Other otitis externa, bilateral: Secondary | ICD-10-CM | POA: Diagnosis not present

## 2020-03-03 MED ORDER — HYDROCORTISONE (PERIANAL) 2.5 % EX CREA: 1.0000 "application " | TOPICAL_CREAM | CUTANEOUS | 0 refills | Status: DC

## 2020-03-03 NOTE — Telephone Encounter (Signed)
Script sent  

## 2020-03-03 NOTE — Telephone Encounter (Signed)
Call to patient, left message advising requested RX has been sent to the pharmacy.   Encounter closed.

## 2020-03-15 DIAGNOSIS — Z961 Presence of intraocular lens: Secondary | ICD-10-CM | POA: Diagnosis not present

## 2020-03-15 DIAGNOSIS — H353131 Nonexudative age-related macular degeneration, bilateral, early dry stage: Secondary | ICD-10-CM | POA: Diagnosis not present

## 2020-03-15 DIAGNOSIS — H5213 Myopia, bilateral: Secondary | ICD-10-CM | POA: Diagnosis not present

## 2020-04-15 DIAGNOSIS — Z23 Encounter for immunization: Secondary | ICD-10-CM | POA: Diagnosis not present

## 2020-04-22 DIAGNOSIS — Z79899 Other long term (current) drug therapy: Secondary | ICD-10-CM | POA: Diagnosis not present

## 2020-04-22 DIAGNOSIS — R7309 Other abnormal glucose: Secondary | ICD-10-CM | POA: Diagnosis not present

## 2020-04-22 DIAGNOSIS — J3089 Other allergic rhinitis: Secondary | ICD-10-CM | POA: Diagnosis not present

## 2020-04-22 DIAGNOSIS — Z7189 Other specified counseling: Secondary | ICD-10-CM | POA: Diagnosis not present

## 2020-04-22 DIAGNOSIS — S92901S Unspecified fracture of right foot, sequela: Secondary | ICD-10-CM | POA: Diagnosis not present

## 2020-04-22 DIAGNOSIS — Z0000 Encounter for general adult medical examination without abnormal findings: Secondary | ICD-10-CM | POA: Diagnosis not present

## 2020-04-22 DIAGNOSIS — E559 Vitamin D deficiency, unspecified: Secondary | ICD-10-CM | POA: Diagnosis not present

## 2020-04-22 DIAGNOSIS — Z23 Encounter for immunization: Secondary | ICD-10-CM | POA: Diagnosis not present

## 2020-04-22 DIAGNOSIS — Z1389 Encounter for screening for other disorder: Secondary | ICD-10-CM | POA: Diagnosis not present

## 2020-04-22 DIAGNOSIS — E785 Hyperlipidemia, unspecified: Secondary | ICD-10-CM | POA: Diagnosis not present

## 2020-04-22 DIAGNOSIS — L299 Pruritus, unspecified: Secondary | ICD-10-CM | POA: Diagnosis not present

## 2020-05-24 ENCOUNTER — Telehealth: Payer: Self-pay

## 2020-05-24 MED ORDER — HYDROCORTISONE (PERIANAL) 2.5 % EX CREA: 1.0000 "application " | TOPICAL_CREAM | CUTANEOUS | 1 refills | Status: DC

## 2020-05-24 NOTE — Telephone Encounter (Signed)
Patient want to know if Dr Sabra Heck would send a prescription for hemorrhoids to the pharmacy or should she come in.

## 2020-05-24 NOTE — Telephone Encounter (Signed)
Rx completed.  Ok for pt to take Colace every day as well.  Thanks.

## 2020-05-24 NOTE — Telephone Encounter (Addendum)
Spoke with pt. Pt requesting refill of Anusol 2.5 % rectal cream for hemorrhoids. Pt last seen for AEX 09/18/19, next not scheduled due to pt plans to transfer to [place].  Last refill sent 03/03/20  Pt reports discomfort from hemorrhoid over the past 3-4 days. States has tried rectal suppository with no relief. Denies any rectal bleeding or severe pain. Pt also reports taking 100mg  Colace every other day. Asking if ok to take daily.  Advised will review with Dr Sabra Heck and return call. Pt agreeable.   Routing to Dr Sabra Heck, please advise. Pharmacy verified. Rx pended if approved. # 30g, 1 RF

## 2020-05-24 NOTE — Telephone Encounter (Signed)
Spoke back with pt. Pt given update on Rx and Colace. Pt agreeable and verbalized understanding. Pt thankful for Rx and callback. Pt states has called and placed name on list to transfer to [place].  Encounter closed

## 2020-06-07 ENCOUNTER — Other Ambulatory Visit: Payer: Self-pay | Admitting: Internal Medicine

## 2020-06-07 DIAGNOSIS — Z1231 Encounter for screening mammogram for malignant neoplasm of breast: Secondary | ICD-10-CM

## 2020-06-09 DIAGNOSIS — M84374A Stress fracture, right foot, initial encounter for fracture: Secondary | ICD-10-CM | POA: Diagnosis not present

## 2020-06-09 DIAGNOSIS — M25571 Pain in right ankle and joints of right foot: Secondary | ICD-10-CM | POA: Diagnosis not present

## 2020-06-22 DIAGNOSIS — R269 Unspecified abnormalities of gait and mobility: Secondary | ICD-10-CM | POA: Diagnosis not present

## 2020-06-22 DIAGNOSIS — M79671 Pain in right foot: Secondary | ICD-10-CM | POA: Diagnosis not present

## 2020-06-29 DIAGNOSIS — M79671 Pain in right foot: Secondary | ICD-10-CM | POA: Diagnosis not present

## 2020-06-29 DIAGNOSIS — R269 Unspecified abnormalities of gait and mobility: Secondary | ICD-10-CM | POA: Diagnosis not present

## 2020-07-19 ENCOUNTER — Other Ambulatory Visit: Payer: Self-pay

## 2020-07-19 ENCOUNTER — Ambulatory Visit
Admission: RE | Admit: 2020-07-19 | Discharge: 2020-07-19 | Disposition: A | Discharge status: Home / Self Care | Payer: Medicare Other | Source: Ambulatory Visit | Attending: Internal Medicine | Admitting: Internal Medicine

## 2020-07-19 DIAGNOSIS — Z1231 Encounter for screening mammogram for malignant neoplasm of breast: Secondary | ICD-10-CM | POA: Diagnosis not present

## 2020-08-18 DIAGNOSIS — Z20822 Contact with and (suspected) exposure to covid-19: Secondary | ICD-10-CM | POA: Diagnosis not present

## 2020-08-18 DIAGNOSIS — R519 Headache, unspecified: Secondary | ICD-10-CM | POA: Diagnosis not present

## 2020-08-22 DIAGNOSIS — R519 Headache, unspecified: Secondary | ICD-10-CM | POA: Diagnosis not present

## 2020-08-24 DIAGNOSIS — J3089 Other allergic rhinitis: Secondary | ICD-10-CM | POA: Diagnosis not present

## 2020-08-24 DIAGNOSIS — J0110 Acute frontal sinusitis, unspecified: Secondary | ICD-10-CM | POA: Diagnosis not present

## 2020-08-24 DIAGNOSIS — R519 Headache, unspecified: Secondary | ICD-10-CM | POA: Diagnosis not present

## 2020-10-03 ENCOUNTER — Ambulatory Visit: Payer: Medicare Other

## 2020-10-04 ENCOUNTER — Other Ambulatory Visit: Payer: Self-pay | Admitting: Obstetrics & Gynecology

## 2020-10-25 ENCOUNTER — Encounter (HOSPITAL_BASED_OUTPATIENT_CLINIC_OR_DEPARTMENT_OTHER): Payer: Self-pay | Admitting: Obstetrics & Gynecology

## 2020-10-25 ENCOUNTER — Other Ambulatory Visit: Payer: Self-pay

## 2020-10-25 ENCOUNTER — Ambulatory Visit (INDEPENDENT_AMBULATORY_CARE_PROVIDER_SITE_OTHER): Payer: Medicare Other | Admitting: Obstetrics & Gynecology

## 2020-10-25 ENCOUNTER — Other Ambulatory Visit (HOSPITAL_BASED_OUTPATIENT_CLINIC_OR_DEPARTMENT_OTHER)
Admission: RE | Admit: 2020-10-25 | Discharge: 2020-10-25 | Disposition: A | Discharge status: Home / Self Care | Payer: Medicare Other | Source: Ambulatory Visit | Attending: Obstetrics & Gynecology | Admitting: Obstetrics & Gynecology

## 2020-10-25 VITALS — BP 123/69 | HR 69 | Ht 61.75 in | Wt 123.0 lb

## 2020-10-25 DIAGNOSIS — Z8639 Personal history of other endocrine, nutritional and metabolic disease: Secondary | ICD-10-CM | POA: Diagnosis not present

## 2020-10-25 DIAGNOSIS — Z01419 Encounter for gynecological examination (general) (routine) without abnormal findings: Secondary | ICD-10-CM | POA: Diagnosis not present

## 2020-10-25 DIAGNOSIS — Z79890 Hormone replacement therapy (postmenopausal): Secondary | ICD-10-CM | POA: Diagnosis not present

## 2020-10-25 DIAGNOSIS — R1903 Right lower quadrant abdominal swelling, mass and lump: Secondary | ICD-10-CM | POA: Diagnosis not present

## 2020-10-25 DIAGNOSIS — F5101 Primary insomnia: Secondary | ICD-10-CM | POA: Diagnosis not present

## 2020-10-25 DIAGNOSIS — R8290 Unspecified abnormal findings in urine: Secondary | ICD-10-CM | POA: Diagnosis not present

## 2020-10-25 DIAGNOSIS — R350 Frequency of micturition: Secondary | ICD-10-CM | POA: Diagnosis not present

## 2020-10-25 DIAGNOSIS — G8929 Other chronic pain: Secondary | ICD-10-CM | POA: Diagnosis not present

## 2020-10-25 DIAGNOSIS — R1031 Right lower quadrant pain: Secondary | ICD-10-CM

## 2020-10-25 DIAGNOSIS — Z01411 Encounter for gynecological examination (general) (routine) with abnormal findings: Secondary | ICD-10-CM

## 2020-10-25 LAB — POCT URINALYSIS DIPSTICK
Appearance: ABNORMAL
Bilirubin, UA: NEGATIVE
Blood, UA: NEGATIVE
Glucose, UA: NEGATIVE
Leukocytes, UA: NEGATIVE
Nitrite, UA: NEGATIVE
Protein, UA: NEGATIVE
Spec Grav, UA: >=1.03 — AB (ref 1.010–1.025)
Urobilinogen, UA: 0.2 E.U./dL
pH, UA: 5 (ref 5.0–8.0)

## 2020-10-25 MED ORDER — MEDROXYPROGESTERONE ACETATE 2.5 MG PO TABS: 2.5000 mg | ORAL_TABLET | Freq: Every day | ORAL | 3 refills | Status: DC

## 2020-10-25 MED ORDER — TRAZODONE HCL 50 MG PO TABS: ORAL_TABLET | ORAL | 0 refills | Status: DC

## 2020-10-25 MED ORDER — ESTRADIOL 0.05 MG/24HR TD PTTW: 1.0000 | MEDICATED_PATCH | TRANSDERMAL | 4 refills | Status: DC

## 2020-10-25 NOTE — Progress Notes (Signed)
75 y.o. G0P0 Married White or Caucasian female here for breast and pelvic exam.  I am following her for HRT.  Dr. Fara Olden really wants her off her HRT.  She has been taking every other day and is miserable.  Having hot flashes and night sweats.  Having trouble sleeping.  Feels her quality of life is just not as good.  We discussed breast cancer, stroke, MI, DVT/PE risks and she is very willing to accept these risks for improved quality of life.  We have tried to wean her HRT in the past with the same side effects.  Will change to transdermal as cost is now lower.  She is on the lowest dosage of both the patch and progesterone.   Denies vaginal bleeding.  Having issues with sleep.  Did use gabapentin for foot issue and this helped but she woke up feeling "odd" after taking it.  Was using the 100mg  dosage.  Is using tylenol pm.  Pharmacist told her this could interfere with cognitive function.  Wants other options.  Having some skin itching in some locations.  No pattern to this.  No visible skin changes per pt.  Contains to have RLQ chronic pain with bloating.  She has noticed that lactose free has helped.    Patient's last menstrual period was 07/24/1991.          Sexually active: Yes.    H/O STD:  no  Health Maintenance: PCP:  Dr. Fara Olden.  Last wellness appt was 04/2020.  Did blood work at that appt:  yes Vaccines are up to date:  yes Colonoscopy:  08/2018 MMG:  07/20/2020 BMD:  07/09/2019 Last pap smear:  08/2019.   H/o abnormal pap smear:  no   reports that she has never smoked. She has never used smokeless tobacco. She reports that she does not drink alcohol and does not use drugs.  Past Medical History:  Diagnosis Date  . Endometriosis 3/96   bx neg, suspicious for endo  . Frozen shoulder 2010  . Infertility, female   . Menopause   . Ulcer     Past Surgical History:  Procedure Laterality Date  . APPENDECTOMY    . AUGMENTATION MAMMAPLASTY  1975  . CATARACT EXTRACTION      Dr. Delana Meyer  . CYST EXCISION Left 04/2015   ear   . CYST EXCISION Left 09/2015   Shoulder  . DILATION AND CURETTAGE OF UTERUS  1993  . EYE SURGERY  11/02/14  . FOOT SURGERY    . OTHER SURGICAL HISTORY  3/07   excision fo cx granuloma tissue under anesthesia  . PELVIC LAPAROSCOPY  3/96   due to pelvic pain  . TONSILLECTOMY AND ADENOIDECTOMY      Current Outpatient Medications  Medication Sig Dispense Refill  . Ascorbic Acid (VITAMIN C PO) Take by mouth.    . cholecalciferol (VITAMIN D) 1000 UNITS tablet Take 1,000 Units by mouth daily.    . Coenzyme Q10 (CO Q 10) 100 MG CAPS Take by mouth daily.    Marland Kitchen estradiol (VIVELLE-DOT) 0.05 MG/24HR patch Place 1 patch (0.05 mg total) onto the skin 2 (two) times a week. 24 patch 4  . fluticasone (FLONASE) 50 MCG/ACT nasal spray Place 1 spray into both nostrils 2 (two) times a day.     . Multiple Vitamin (MULTIVITAMIN) tablet Take 1 tablet by mouth daily.    . Multiple Vitamins-Minerals (PRESERVISION AREDS PO) Take 2 tablets by mouth daily.    . rosuvastatin (  CRESTOR) 5 MG tablet Take 1 tablet by mouth daily.    . traZODone (DESYREL) 50 MG tablet 1/2 tab nightly prn insomnia 30 tablet 0  . medroxyPROGESTERone (PROVERA) 2.5 MG tablet Take 1 tablet (2.5 mg total) by mouth daily. 90 tablet 3   No current facility-administered medications for this visit.    Family History  Problem Relation Age of Onset  . Dementia Mother     Review of Systems  Constitutional:       Difficulty with sleep  HENT: Negative.   Respiratory: Negative.   Cardiovascular: Negative.   Gastrointestinal: Negative.   Genitourinary: Negative for vaginal bleeding.       Urine odor  Hematological: Negative.   Psychiatric/Behavioral: Negative.   All other systems reviewed and are negative.   Exam:   BP 123/69   Pulse 69   Ht 5' 1.75" (1.568 m)   Wt 123 lb (55.8 kg)   LMP 07/24/1991   BMI 22.68 kg/m   Height: 5' 1.75" (156.8 cm)  General appearance:  alert, cooperative and appears stated age Breasts: normal appearance, no masses or tenderness Abdomen: soft, non-tender; bowel sounds normal; no masses,  no organomegaly Lymph nodes: Cervical, supraclavicular, and axillary nodes normal.  No abnormal inguinal nodes palpated Neurologic: Grossly normal  Pelvic: External genitalia:  no lesions              Urethra:  normal appearing urethra with no masses, tenderness or lesions              Bartholins and Skenes: normal                 Vagina: normal appearing vagina with atrophic changes, no discharge, no lesions              Cervix: no lesions              Pap taken: No. Bimanual Exam:  Uterus:  normal size, contour, position, consistency, mobility, non-tender              Adnexa: normal adnexa and no mass, fullness, tenderness               Rectovaginal: Confirms               Anus:  normal sphincter tone, no lesions  Chaperone, Prince Rome, CMA, was present for exam.  Assessment/Plan: 1. Encntr for gyn exam (general) (routine) w/o abn findings - pap smears no longer indicated - continue to recommend yearly MMG due to HRT use - colonoscopy 08/2018 - BMD 06/2019 - lab work with PCP - vaccines updated  2. Hormone replacement therapy (HRT) - lengthy discussion occurred with pt regarding HRT due to Dr. Quentin Cornwall concerns.  Pt has tried to wean off HRT and is miserable with hot flashes.  She is clearly aware of risks and we reviewed these again today:  DVT/PE, stroke, MI, breast cancer.  Aware if has any of these issues, she will have to be off HRT.  Is on lowest dosages of both but will transition to transdermal patch for pt.  She fully accepts these risks and does not want to be off HRT due to quality of life concerns.   - medroxyPROGESTERone (PROVERA) 2.5 MG tablet; Take 1 tablet (2.5 mg total) by mouth daily.  Dispense: 90 tablet; Refill: 3 - estradiol (VIVELLE-DOT) 0.05 MG/24HR patch; Place 1 patch (0.05 mg total) onto the skin 2  (two) times a week.  Dispense: 24 patch; Refill: 4  3. Chronic RLQ pain and Right lower quadrant abdominal swelling, mass and lump - CA 125; Future  4. Abnormal urine odor - Urine Culture  5. Urinary frequency - POCT Urinalysis Dipstick  7.  Primary insomnia - do not feel OTC products like tylenol PM are the best for her.  Will try Trazodone.  She has used Gabapentin in the past but felt medicated after taking it. - traZODone (DESYREL) 50 MG tablet; 1/2 tab nightly prn insomnia  Dispense: 30 tablet; Refill: 0  Total time with pt including documentation: 35 minutes

## 2020-10-26 ENCOUNTER — Encounter (HOSPITAL_BASED_OUTPATIENT_CLINIC_OR_DEPARTMENT_OTHER): Payer: Self-pay | Admitting: *Deleted

## 2020-10-26 DIAGNOSIS — Z79890 Hormone replacement therapy (postmenopausal): Secondary | ICD-10-CM

## 2020-10-26 LAB — CA 125: Cancer Antigen (CA) 125: 22.1 U/mL (ref 0.0–38.1)

## 2020-10-27 ENCOUNTER — Encounter (HOSPITAL_BASED_OUTPATIENT_CLINIC_OR_DEPARTMENT_OTHER): Payer: Self-pay

## 2020-10-27 DIAGNOSIS — Z79890 Hormone replacement therapy (postmenopausal): Secondary | ICD-10-CM | POA: Insufficient documentation

## 2020-10-27 DIAGNOSIS — R1903 Right lower quadrant abdominal swelling, mass and lump: Secondary | ICD-10-CM | POA: Insufficient documentation

## 2020-10-27 DIAGNOSIS — R8290 Unspecified abnormal findings in urine: Secondary | ICD-10-CM | POA: Insufficient documentation

## 2020-10-27 DIAGNOSIS — F5101 Primary insomnia: Secondary | ICD-10-CM | POA: Insufficient documentation

## 2020-10-27 LAB — URINE CULTURE

## 2020-10-27 NOTE — Telephone Encounter (Signed)
DOB verified. Notified pt that urine culture showed some bacterial growth but was unable to determine specific kind. Asked pt to come to the lab and leave another urine sample to send off so that Dr. Sabra Heck would best know how to treat. Pt states that she will come by tomorrow.  Pt placed on lab schedule.

## 2020-10-27 NOTE — Telephone Encounter (Signed)
-----   Message from Megan Salon, MD sent at 10/27/2020  1:38 PM EDT ----- Please call pt and let her know that the urine culture grew bacteria but was not able to identify a specific bacteria.  We need to repeat the urine culture.  Could she please come in an leave another urine same.  She just needs to go to the lab on the ground floor but we need to put her on the lab schedule so we need to know what day she can come.  Order placed for repeat urine culture.

## 2020-10-27 NOTE — Addendum Note (Signed)
Addended by: Megan Salon on: 10/27/2020 01:38 PM   Modules accepted: Orders

## 2020-10-28 ENCOUNTER — Other Ambulatory Visit: Payer: Self-pay

## 2020-10-28 ENCOUNTER — Telehealth: Payer: Self-pay

## 2020-10-28 ENCOUNTER — Other Ambulatory Visit (HOSPITAL_BASED_OUTPATIENT_CLINIC_OR_DEPARTMENT_OTHER)
Admission: RE | Admit: 2020-10-28 | Discharge: 2020-10-28 | Disposition: A | Discharge status: Home / Self Care | Payer: Medicare Other | Source: Ambulatory Visit | Attending: Obstetrics & Gynecology | Admitting: Obstetrics & Gynecology

## 2020-10-28 DIAGNOSIS — R8290 Unspecified abnormal findings in urine: Secondary | ICD-10-CM | POA: Insufficient documentation

## 2020-10-28 DIAGNOSIS — R350 Frequency of micturition: Secondary | ICD-10-CM | POA: Insufficient documentation

## 2020-10-28 MED ORDER — ESTRADIOL 0.05 MG/24HR TD PTTW: 1.0000 | MEDICATED_PATCH | TRANSDERMAL | 4 refills | Status: DC

## 2020-10-28 NOTE — Telephone Encounter (Signed)
Alexandra Cain is a 75 y.o. female was contacted by Maudie Mercury, Therapist, sports

## 2020-10-28 NOTE — Telephone Encounter (Signed)
-----   Message from Megan Salon, MD sent at 10/27/2020  6:35 AM EDT ----- Please let pt know this is normal.  Thanks.

## 2020-10-29 LAB — URINE CULTURE: Culture: <10000 — AB

## 2020-11-01 ENCOUNTER — Other Ambulatory Visit: Payer: Self-pay

## 2020-11-01 ENCOUNTER — Ambulatory Visit (INDEPENDENT_AMBULATORY_CARE_PROVIDER_SITE_OTHER): Payer: Medicare Other | Admitting: Otolaryngology

## 2020-11-01 ENCOUNTER — Encounter (INDEPENDENT_AMBULATORY_CARE_PROVIDER_SITE_OTHER): Payer: Self-pay | Admitting: Otolaryngology

## 2020-11-01 VITALS — Temp 97.2°F

## 2020-11-01 DIAGNOSIS — K219 Gastro-esophageal reflux disease without esophagitis: Secondary | ICD-10-CM | POA: Diagnosis not present

## 2020-11-01 DIAGNOSIS — H6123 Impacted cerumen, bilateral: Secondary | ICD-10-CM

## 2020-11-01 NOTE — Progress Notes (Signed)
HPI: Alexandra Cain is a 75 y.o. female who presents for evaluation of wax buildup in her ears.  She is a patient of Alexandra Cain who used to clean her ears on a regular basis about every year. She also presents for evaluation of having to clear her throat all the time throughout the year.  She feels like she always has to clear her throat.  Mostly just a small amount of clear mucus.  But she always feels like she has something in her throat.  She used to have acid reflux and take medication for this but does not take any medication presently for reflux.  She has done better since reducing lactose in her diet. She does use Flonase by prescription..  Past Medical History:  Diagnosis Date  . Endometriosis 3/96   bx neg, suspicious for endo  . Frozen shoulder 2010  . Infertility, female   . Menopause   . Ulcer    Past Surgical History:  Procedure Laterality Date  . APPENDECTOMY    . AUGMENTATION MAMMAPLASTY  1975  . CATARACT EXTRACTION     Alexandra Cain  . CYST EXCISION Left 04/2015   ear   . CYST EXCISION Left 09/2015   Shoulder  . DILATION AND CURETTAGE OF UTERUS  1993  . EYE SURGERY  11/02/14  . FOOT SURGERY    . OTHER SURGICAL HISTORY  3/07   excision fo cx granuloma tissue under anesthesia  . PELVIC LAPAROSCOPY  3/96   due to pelvic pain  . TONSILLECTOMY AND ADENOIDECTOMY     Social History   Socioeconomic History  . Marital status: Married    Spouse name: Not on file  . Number of children: Not on file  . Years of education: Not on file  . Highest education level: Not on file  Occupational History  . Not on file  Tobacco Use  . Smoking status: Never Smoker  . Smokeless tobacco: Never Used  Vaping Use  . Vaping Use: Never used  Substance and Sexual Activity  . Alcohol use: No  . Drug use: No  . Sexual activity: Yes    Partners: Male    Birth control/protection: Post-menopausal, None  Other Topics Concern  . Not on file  Social History Narrative  . Not on file    Social Determinants of Health   Financial Resource Strain: Not on file  Food Insecurity: Not on file  Transportation Needs: Not on file  Physical Activity: Not on file  Stress: Not on file  Social Connections: Not on file   Family History  Problem Relation Age of Onset  . Dementia Mother    Allergies  Allergen Reactions  . Demerol [Meperidine Hcl]     Uncontrollable jerking.  Marland Kitchen Hydrocodone-Acetaminophen Hives and Itching    Pt stated, "It made my heart skip a beat and made me itch"   Prior to Admission medications   Medication Sig Start Date End Date Taking? Authorizing Provider  Ascorbic Acid (VITAMIN C PO) Take by mouth.    Alexandra Cain  cholecalciferol (VITAMIN D) 1000 UNITS tablet Take 1,000 Units by mouth daily.    Alexandra Cain  Coenzyme Q10 (CO Q 10) 100 MG CAPS Take by mouth daily.    Alexandra Cain  estradiol (VIVELLE-DOT) 0.05 MG/24HR patch Place 1 patch (0.05 mg total) onto the skin 2 (two) times a week. 10/31/20   Alexandra Salon, Cain  fluticasone Asencion Islam) 50 MCG/ACT nasal spray Place 1  spray into both nostrils 2 (two) times a day.  04/11/17   Alexandra Cain  medroxyPROGESTERone (PROVERA) 2.5 MG tablet Take 1 tablet (2.5 mg total) by mouth daily. 10/25/20   Alexandra Salon, Cain  Multiple Vitamin (MULTIVITAMIN) tablet Take 1 tablet by mouth daily.    Alexandra Cain  Multiple Vitamins-Minerals (PRESERVISION AREDS PO) Take 2 tablets by mouth daily.    Alexandra Cain  rosuvastatin (CRESTOR) 5 MG tablet Take 1 tablet by mouth daily. 06/06/17   Alexandra Cain  traZODone (DESYREL) 50 MG tablet 1/2 tab nightly prn insomnia 10/25/20   Alexandra Salon, Cain     Positive ROS: Otherwise negative.  All other systems have been reviewed and were otherwise negative with the exception of those mentioned in the HPI and as above.  Physical Exam: Constitutional: Alert, well-appearing, no acute distress Ears: External  ears without lesions or tenderness. Ear canals are small bilaterally with a mild amount of wax in both ear canals that was cleaned with curettes and suction.  Both TMs are clear.. Nasal: External nose without lesions. Clear nasal passages with mild rhinitis.  No polyps noted.  Both middle meatus regions are clear.  Mucus within the nasal cavity is clear. Oral: Oropharynx clear..  Patient is status post tonsillectomy.  Indirect laryngoscopy revealed a clear base of tongue vallecula and epiglottis.  Piriform sinuses were clear.  Vocal cords were clear with normal vocal mobility.  Arytenoids with presence of mild edema but no erythema and no mucosal lesions noted. Neck: No palpable adenopathy or masses. Respiratory: Breathing comfortably  Skin: No facial/neck lesions or rash noted.  Procedures  Assessment: Wax buildup in both ear canals which are small.  TMs are clear bilaterally. Throat clearing with normal hypopharyngeal laryngeal examination. Perhaps may be related to reflux but I am not sure I would necessarily recommend regular use of antiacid therapy as she does fairly well with reducing acid reflux from reducing the lactose she takes.  Plan: Recommended regular use of her Flonase which she is presently using and suggested trying saline nasal rinse to help some with postnasal drainage from the nasal cavity. Reassured her of normal hypopharyngeal laryngeal examination otherwise.  Alexandra Journey, Cain

## 2020-11-03 DIAGNOSIS — Z23 Encounter for immunization: Secondary | ICD-10-CM | POA: Diagnosis not present

## 2020-11-15 ENCOUNTER — Other Ambulatory Visit: Payer: Self-pay | Admitting: Obstetrics & Gynecology

## 2020-12-24 ENCOUNTER — Other Ambulatory Visit: Payer: Self-pay | Admitting: Obstetrics & Gynecology

## 2020-12-26 NOTE — Telephone Encounter (Signed)
Spoke with patient regarding refill request. Pt states that her insurance would not approve the patch so that is why she is needing a refill on the pill.

## 2021-01-30 DIAGNOSIS — D1801 Hemangioma of skin and subcutaneous tissue: Secondary | ICD-10-CM | POA: Diagnosis not present

## 2021-01-30 DIAGNOSIS — L821 Other seborrheic keratosis: Secondary | ICD-10-CM | POA: Diagnosis not present

## 2021-01-30 DIAGNOSIS — L718 Other rosacea: Secondary | ICD-10-CM | POA: Diagnosis not present

## 2021-01-30 DIAGNOSIS — L820 Inflamed seborrheic keratosis: Secondary | ICD-10-CM | POA: Diagnosis not present

## 2021-03-22 DIAGNOSIS — H353131 Nonexudative age-related macular degeneration, bilateral, early dry stage: Secondary | ICD-10-CM | POA: Diagnosis not present

## 2021-03-22 DIAGNOSIS — Z961 Presence of intraocular lens: Secondary | ICD-10-CM | POA: Diagnosis not present

## 2021-03-22 DIAGNOSIS — H5213 Myopia, bilateral: Secondary | ICD-10-CM | POA: Diagnosis not present

## 2021-03-28 DIAGNOSIS — R197 Diarrhea, unspecified: Secondary | ICD-10-CM | POA: Diagnosis not present

## 2021-03-29 DIAGNOSIS — R197 Diarrhea, unspecified: Secondary | ICD-10-CM | POA: Diagnosis not present

## 2021-05-12 DIAGNOSIS — E559 Vitamin D deficiency, unspecified: Secondary | ICD-10-CM | POA: Diagnosis not present

## 2021-05-12 DIAGNOSIS — E785 Hyperlipidemia, unspecified: Secondary | ICD-10-CM | POA: Diagnosis not present

## 2021-05-12 DIAGNOSIS — Z1389 Encounter for screening for other disorder: Secondary | ICD-10-CM | POA: Diagnosis not present

## 2021-05-12 DIAGNOSIS — R911 Solitary pulmonary nodule: Secondary | ICD-10-CM | POA: Diagnosis not present

## 2021-05-12 DIAGNOSIS — Z23 Encounter for immunization: Secondary | ICD-10-CM | POA: Diagnosis not present

## 2021-05-12 DIAGNOSIS — J3089 Other allergic rhinitis: Secondary | ICD-10-CM | POA: Diagnosis not present

## 2021-05-12 DIAGNOSIS — Z0000 Encounter for general adult medical examination without abnormal findings: Secondary | ICD-10-CM | POA: Diagnosis not present

## 2021-05-12 DIAGNOSIS — M85859 Other specified disorders of bone density and structure, unspecified thigh: Secondary | ICD-10-CM | POA: Diagnosis not present

## 2021-07-03 ENCOUNTER — Other Ambulatory Visit: Payer: Self-pay | Admitting: Internal Medicine

## 2021-07-03 DIAGNOSIS — Z1231 Encounter for screening mammogram for malignant neoplasm of breast: Secondary | ICD-10-CM

## 2021-07-27 DIAGNOSIS — Z20822 Contact with and (suspected) exposure to covid-19: Secondary | ICD-10-CM | POA: Diagnosis not present

## 2021-07-27 DIAGNOSIS — Z23 Encounter for immunization: Secondary | ICD-10-CM | POA: Diagnosis not present

## 2021-08-04 ENCOUNTER — Ambulatory Visit
Admission: RE | Admit: 2021-08-04 | Discharge: 2021-08-04 | Disposition: A | Discharge status: Home / Self Care | Payer: Medicare Other | Source: Ambulatory Visit | Attending: Internal Medicine | Admitting: Internal Medicine

## 2021-08-04 DIAGNOSIS — Z1231 Encounter for screening mammogram for malignant neoplasm of breast: Secondary | ICD-10-CM | POA: Diagnosis not present

## 2021-09-25 DIAGNOSIS — M85859 Other specified disorders of bone density and structure, unspecified thigh: Secondary | ICD-10-CM | POA: Diagnosis not present

## 2021-09-25 DIAGNOSIS — J0110 Acute frontal sinusitis, unspecified: Secondary | ICD-10-CM | POA: Diagnosis not present

## 2021-09-27 ENCOUNTER — Other Ambulatory Visit: Payer: Self-pay | Admitting: Internal Medicine

## 2021-09-27 DIAGNOSIS — M85859 Other specified disorders of bone density and structure, unspecified thigh: Secondary | ICD-10-CM

## 2021-10-06 DIAGNOSIS — J3489 Other specified disorders of nose and nasal sinuses: Secondary | ICD-10-CM | POA: Diagnosis not present

## 2021-10-06 DIAGNOSIS — J329 Chronic sinusitis, unspecified: Secondary | ICD-10-CM | POA: Diagnosis not present

## 2021-10-06 DIAGNOSIS — R519 Headache, unspecified: Secondary | ICD-10-CM | POA: Diagnosis not present

## 2021-10-25 ENCOUNTER — Other Ambulatory Visit: Payer: Self-pay | Admitting: Obstetrics & Gynecology

## 2021-10-26 ENCOUNTER — Ambulatory Visit (HOSPITAL_BASED_OUTPATIENT_CLINIC_OR_DEPARTMENT_OTHER): Payer: BLUE CROSS/BLUE SHIELD | Admitting: Obstetrics & Gynecology

## 2021-10-26 ENCOUNTER — Telehealth (HOSPITAL_BASED_OUTPATIENT_CLINIC_OR_DEPARTMENT_OTHER): Payer: Self-pay | Admitting: Obstetrics & Gynecology

## 2021-10-26 NOTE — Telephone Encounter (Signed)
Called patient and left a message to please call the office back to reschedule her missed appointment . ?

## 2021-11-06 ENCOUNTER — Other Ambulatory Visit (HOSPITAL_BASED_OUTPATIENT_CLINIC_OR_DEPARTMENT_OTHER): Payer: Self-pay | Admitting: Obstetrics & Gynecology

## 2021-11-06 DIAGNOSIS — Z79890 Hormone replacement therapy (postmenopausal): Secondary | ICD-10-CM

## 2021-11-07 ENCOUNTER — Ambulatory Visit (HOSPITAL_BASED_OUTPATIENT_CLINIC_OR_DEPARTMENT_OTHER): Payer: Medicare Other | Admitting: Obstetrics & Gynecology

## 2021-11-15 DIAGNOSIS — L308 Other specified dermatitis: Secondary | ICD-10-CM | POA: Diagnosis not present

## 2021-11-15 DIAGNOSIS — L720 Epidermal cyst: Secondary | ICD-10-CM | POA: Diagnosis not present

## 2021-11-15 DIAGNOSIS — L820 Inflamed seborrheic keratosis: Secondary | ICD-10-CM | POA: Diagnosis not present

## 2021-12-05 ENCOUNTER — Ambulatory Visit (INDEPENDENT_AMBULATORY_CARE_PROVIDER_SITE_OTHER): Payer: Medicare Other | Admitting: Allergy and Immunology

## 2021-12-05 ENCOUNTER — Encounter: Payer: Self-pay | Admitting: Allergy and Immunology

## 2021-12-05 VITALS — BP 110/66 | HR 80 | Temp 97.9°F | Resp 16 | Ht 62.5 in | Wt 127.4 lb

## 2021-12-05 DIAGNOSIS — K219 Gastro-esophageal reflux disease without esophagitis: Secondary | ICD-10-CM | POA: Diagnosis not present

## 2021-12-05 DIAGNOSIS — J3089 Other allergic rhinitis: Secondary | ICD-10-CM

## 2021-12-05 DIAGNOSIS — H6981 Other specified disorders of Eustachian tube, right ear: Secondary | ICD-10-CM

## 2021-12-05 MED ORDER — FLUTICASONE PROPIONATE 50 MCG/ACT NA SUSP: 1.0000 | Freq: Every day | NASAL | 5 refills | Status: AC

## 2021-12-05 MED ORDER — OMEPRAZOLE 40 MG PO CPDR: 40.0000 mg | DELAYED_RELEASE_CAPSULE | Freq: Every day | ORAL | 5 refills | Status: DC

## 2021-12-05 MED ORDER — IPRATROPIUM BROMIDE 0.06 % NA SOLN: 2.0000 | Freq: Four times a day (QID) | NASAL | 5 refills | Status: DC

## 2021-12-05 MED ORDER — FAMOTIDINE 40 MG PO TABS: 40.0000 mg | ORAL_TABLET | Freq: Every evening | ORAL | 5 refills | Status: DC

## 2021-12-05 NOTE — Patient Instructions (Addendum)
?  1.  Allergen avoidance measures??? ? ?2.  Treat and prevent reflux/LPR: ? ?A. Minimize chocolate consumption ?B. Omeprazole 40 mg - 1 tablet in AM ?C. Famotidine 40 mg - 1 tablet in PM ?D. Replace throat clearing with swallowing/drinking maneuver ? ?3.  Treat and prevent inflammation: ? ?A. Flonase - 1-2 sprays each nostril 3 times per week ? ?4. If needed: ? ?A. Ipratropium 0.06% - 2 sprays each nostril every 6 hours to dry nose ? ?5. Return to clinic in 4 weeks or earlier if problem ?

## 2021-12-05 NOTE — Progress Notes (Signed)
?[place] ? ? ?Dear Dr. Fara Olden, ? ?Thank you for referring Alexandra Cain to the [facility] on 12/05/2021.  ? ?Below is a summation of this patient's evaluation and recommendations. ? ?Thank you for your referral. I will keep you informed about this patient's response to treatment.  ? ?If you have any questions please do not hesitate to contact me.  ? ?Sincerely, ? ?Jiles Prows, MD ?Allergy / Immunology ?[facility] ? ? ?______________________________________________________________________ ? ? ? ?NEW PATIENT NOTE ? ?Referring Provider: Leeroy Cha,* ?Primary Provider: Leeroy Cha, MD ?Date of office visit: 12/05/2021 ?   ?Subjective:  ? ?Chief Complaint:  Alexandra Cain (DOB: 09-Feb-1946) is a 76 y.o. female who presents to the clinic on 12/05/2021 with a chief complaint of Headache (Patient thought she had a sinus infection - CT results showed she did not - self referred/friend.) ?.    ? ?HPI: Alexandra Cain presents to this clinic in evaluation of persistent respiratory tract symptoms. ? ?For many years she has been having an issue with throat clearing and a plug in her throat and phlegm coming up from her throat and inability to clear out her throat and coughing and a tickle in her throat.  She also has some runny nose.  She has very little sneezing or nasal congestion or anosmia or ugly nasal discharge.  She had evaluation with ENT for possible sinusitis and apparently she had a CT scan of her sinuses which did not identify any significant amount of sinusitis. ? ?She does not really have a significant amount of reflux symptomatology.  She may have some bloating.  She does not consume any caffeine.  She consumes chocolate about 1 time per week. ? ?She states that occasionally she will get a right earache about 1 time per week for which she will use Flonase.   There is no associated tinnitus or vertigo. ? ?Past Medical History:  ?Diagnosis Date  ? Endometriosis 3/96  ? bx neg, suspicious for endo  ? Frozen shoulder 2010  ? Infertility, female   ? Menopause   ? Ulcer   ? ? ?Past Surgical History:  ?Procedure Laterality Date  ? APPENDECTOMY    ? [place]  ? CATARACT EXTRACTION    ? Dr. Delana Meyer  ? CYST EXCISION Left 04/2015  ? ear   ? CYST EXCISION Left 09/2015  ? Shoulder  ? [place] OF UTERUS  1993  ? EYE SURGERY  11/02/14  ? FOOT SURGERY    ? OTHER SURGICAL HISTORY  3/07  ? excision fo cx granuloma tissue under anesthesia  ? PELVIC LAPAROSCOPY  3/96  ? due to pelvic pain  ? TONSILLECTOMY AND ADENOIDECTOMY    ? ? ?Allergies as of 12/05/2021   ? ?   Reactions  ? Demerol [meperidine Hcl]   ? Uncontrollable jerking.  ? Hydrocodone-acetaminophen Hives, Itching  ? Pt stated, "It made my heart skip a beat and made me itch"  ? Pravastatin Other (See Comments)  ? Simvastatin Other (See Comments), Palpitations  ? ?  ? ?  ?Medication List  ? ? ?cholecalciferol 1000 units tablet ?Commonly known as: VITAMIN D ?Take 1,000 Units by mouth daily. ?  ?Co Q 10 100 MG Caps ?Take by mouth daily. ?  ?estradiol 0.5 MG tablet ?Commonly known as: ESTRACE ?TAKE 1 TABLET BY MOUTH  DAILY ?  ?medroxyPROGESTERone 2.5 MG tablet ?Commonly known as: PROVERA ?TAKE 1 TABLET BY MOUTH  DAILY ?  ?multivitamin tablet ?Take 1 tablet by mouth daily. ?  ?rosuvastatin 5 MG tablet ?Commonly known as: CRESTOR ?Take 1 tablet by mouth daily. ?  ?VITAMIN C PO ?Take by mouth. ?  ? ?Review of systems negative except as noted in HPI / PMHx or noted below: ? ?Review of Systems  ?Constitutional: Negative.   ?HENT: Negative.    ?Eyes: Negative.   ?Respiratory: Negative.    ?Cardiovascular: Negative.   ?Gastrointestinal: Negative.   ?Genitourinary: Negative.   ?Musculoskeletal: Negative.   ?Skin: Negative.   ?Neurological: Negative.   ?Endo/Heme/Allergies: Negative.    ?Psychiatric/Behavioral: Negative.    ? ?Family History  ?Problem Relation Age of Onset  ? Dementia Mother   ? ? ?Social History  ? ?Socioeconomic History  ? Marital status: Married  ?  Spouse name: Not on file  ? Number of children: Not on file  ? Years of education: Not on file  ? Highest education level: Not on file  ?Occupational History  ? Not on file  ?Tobacco Use  ? Smoking status: Never  ? Smokeless tobacco: Never  ?Vaping Use  ? Vaping Use: Never used  ?Substance and Sexual Activity  ? Alcohol use: No  ? Drug use: No  ? Sexual activity: Yes  ?  Partners: Male  ?  Birth control/protection: Post-menopausal, None  ?Other Topics Concern  ? Not on file  ?Social History Narrative  ? Not on file  ? ?Environmental and Social history ? ?Lives in a house with a dry environment, a dog located inside the household, carpet in the bedroom, plastic on the bed, no plastic on the pillow, no smoking ongoing with inside the household.  She is a retired Pharmacist, hospital. ? ?Objective:  ? ?Vitals:  ? 12/05/21 1409  ?BP: 110/66  ?Pulse: 80  ?Resp: 16  ?Temp: 97.9 ?F (36.6 ?C)  ?SpO2: 96%  ? ?Height: 5' 2.5" (158.8 cm) ?Weight: 127 lb 6.4 oz (57.8 kg) ? ?Physical Exam ?Constitutional:   ?   Appearance: She is not diaphoretic.  ?HENT:  ?   Head: Normocephalic.  ?   Right Ear: Tympanic membrane, ear canal and external ear normal.  ?   Left Ear: Tympanic membrane, ear canal and external ear normal.  ?   Nose: Nose normal. No mucosal edema or rhinorrhea.  ?   Mouth/Throat:  ?   Pharynx: Uvula midline. No oropharyngeal exudate.  ?Eyes:  ?   Conjunctiva/sclera: Conjunctivae normal.  ?Neck:  ?   Thyroid: No thyromegaly.  ?   Trachea: Trachea normal. No tracheal tenderness or tracheal deviation.  ?Cardiovascular:  ?   Rate and Rhythm: Normal rate and regular rhythm.  ?   Heart sounds: Normal heart sounds, S1 normal and S2 normal. No murmur heard. ?Pulmonary:  ?   Effort: No respiratory distress.  ?   Breath sounds: Normal breath sounds. No  stridor. No wheezing or rales.  ?Lymphadenopathy:  ?   Head:  ?   Right side of head: No tonsillar adenopathy.  ?   Left side of head: No tonsillar adenopathy.  ?   Cervical: No cervical adenopathy.  ?Skin: ?   Findings: No erythema or rash.  ?   Nails: There is no clubbing.  ?Neurological:  ?   Mental Status: She is alert.  ? ? ?Diagnostics: Allergy skin tests were performed.  She did not demonstrate any  hypersensitivity against a screening panel of aeroallergens. ? ?Results of a sinus CT scan obtained 06 October 2021 identifies the following: ? ?Frontal: Normally aerated. Patent frontal sinus drainage pathways.  ?Ethmoid: Normally aerated.  ?Maxillary: Normally aerated.  ?Sphenoid: Normally aerated. Patent sphenoethmoidal recesses.  ?Right ostiomeatal unit: Patent, coronal image 65.  ?Left ostiomeatal unit: Patent, coronal image 63.  ?Nasal passages: Intact nasal septum, fairly midline. Symmetric but  ?mild nasal cavity mucosal thickening (coronal image 72). Olfactory  ?recesses are clear. No retained secretions.  ? ? ?Assessment and Plan:  ? ? ?1. LPRD (laryngopharyngeal reflux disease)   ?2. Perennial allergic rhinitis   ?3. Dysfunction of right eustachian tube   ? ? ?1.  Allergen avoidance measures??? ? ?2.  Treat and prevent reflux/LPR: ? ?A. Minimize chocolate consumption ?B. Omeprazole 40 mg - 1 tablet in AM ?C. Famotidine 40 mg - 1 tablet in PM ?D. Replace throat clearing with swallowing/drinking maneuver ? ?3.  Treat and prevent inflammation: ? ?A. Flonase - 1-2 sprays each nostril 3 times per week ? ?4. If needed: ? ?A. Ipratropium 0.06% - 2 sprays each nostril every 6 hours to dry nose ? ?5. Return to clinic in 4 weeks or earlier if problem ? ?Ulah has a history very consistent with LPR and we will treat her aggressively for this issue with the therapy noted above.  She also appears to have a history consistent with upper airway inflammation and ETD and she can use Flonase on a relatively consistent basis  as well as nasal ipratropium should it be required.  I will see her back in this clinic in 4 weeks or earlier if there is a problem. ? ?Jiles Prows, MD ?Allergy / Immunology ?[facility]

## 2021-12-06 ENCOUNTER — Encounter: Payer: Self-pay | Admitting: Allergy and Immunology

## 2021-12-28 DIAGNOSIS — Z23 Encounter for immunization: Secondary | ICD-10-CM | POA: Diagnosis not present

## 2022-01-11 ENCOUNTER — Ambulatory Visit (INDEPENDENT_AMBULATORY_CARE_PROVIDER_SITE_OTHER): Payer: Medicare Other | Admitting: Obstetrics & Gynecology

## 2022-01-11 ENCOUNTER — Other Ambulatory Visit (HOSPITAL_COMMUNITY)
Admission: RE | Admit: 2022-01-11 | Discharge: 2022-01-11 | Disposition: A | Discharge status: Home / Self Care | Payer: Medicare Other | Source: Ambulatory Visit | Attending: Obstetrics & Gynecology | Admitting: Obstetrics & Gynecology

## 2022-01-11 ENCOUNTER — Encounter (HOSPITAL_BASED_OUTPATIENT_CLINIC_OR_DEPARTMENT_OTHER): Payer: Self-pay | Admitting: Obstetrics & Gynecology

## 2022-01-11 VITALS — BP 129/60 | HR 79 | Ht 62.5 in | Wt 125.6 lb

## 2022-01-11 DIAGNOSIS — R232 Flushing: Secondary | ICD-10-CM | POA: Diagnosis not present

## 2022-01-11 DIAGNOSIS — Z01411 Encounter for gynecological examination (general) (routine) with abnormal findings: Secondary | ICD-10-CM

## 2022-01-11 DIAGNOSIS — Z124 Encounter for screening for malignant neoplasm of cervix: Secondary | ICD-10-CM

## 2022-01-11 DIAGNOSIS — R1031 Right lower quadrant pain: Secondary | ICD-10-CM

## 2022-01-11 DIAGNOSIS — Z79890 Hormone replacement therapy (postmenopausal): Secondary | ICD-10-CM | POA: Diagnosis not present

## 2022-01-11 DIAGNOSIS — G8929 Other chronic pain: Secondary | ICD-10-CM | POA: Diagnosis not present

## 2022-01-11 DIAGNOSIS — L700 Acne vulgaris: Secondary | ICD-10-CM | POA: Diagnosis not present

## 2022-01-11 DIAGNOSIS — R1903 Right lower quadrant abdominal swelling, mass and lump: Secondary | ICD-10-CM | POA: Diagnosis not present

## 2022-01-11 LAB — POCT URINALYSIS DIPSTICK
Bilirubin, UA: NEGATIVE
Blood, UA: NEGATIVE
Glucose, UA: NEGATIVE
Ketones, UA: NEGATIVE
Leukocytes, UA: NEGATIVE
Nitrite, UA: NEGATIVE
Protein, UA: NEGATIVE
Spec Grav, UA: >=1.03 — AB (ref 1.010–1.025)
Urobilinogen, UA: 0.2 E.U./dL
pH, UA: 5 (ref 5.0–8.0)

## 2022-01-11 MED ORDER — CLINDAMYCIN PHOSPHATE 1 % EX SOLN: Freq: Two times a day (BID) | CUTANEOUS | 0 refills | Status: DC

## 2022-01-11 MED ORDER — ESTRADIOL 0.5 MG PO TABS: 0.5000 mg | ORAL_TABLET | Freq: Every day | ORAL | 3 refills | Status: DC

## 2022-01-11 MED ORDER — GABAPENTIN 100 MG PO CAPS: 100.0000 mg | ORAL_CAPSULE | Freq: Every day | ORAL | 0 refills | Status: DC

## 2022-01-11 MED ORDER — MEDROXYPROGESTERONE ACETATE 2.5 MG PO TABS: 2.5000 mg | ORAL_TABLET | Freq: Every day | ORAL | 3 refills | Status: DC

## 2022-01-11 NOTE — Progress Notes (Unsigned)
76 y.o. G0P0 Married White or Caucasian female here for breast and pelvic exam.  I am also following her for ***.  Denies vaginal bleeding.  Patient's last menstrual period was 07/24/1991.          Sexually active: {yes no:314532}  H/O STD:  {YES NO:22349}  Health Maintenance: PCP:  Dr. Shelbie Proctor.  Last wellness was earlier this year.  Did blood work at that appt:   Vaccines are up to date:  yes Colonoscopy:  08/26/2018, follow up 10 years MMG:  08/04/2021 Negative BMD:  07/09/2019 Last pap smear:  09/18/2019 Negative   H/o abnormal pap smear:      reports that she has never smoked. She has never used smokeless tobacco. She reports that she does not drink alcohol and does not use drugs.  Past Medical History:  Diagnosis Date   Endometriosis 3/96   bx neg, suspicious for endo   Frozen shoulder 2010   Infertility, female    Menopause    Ulcer     Past Surgical History:  Procedure Laterality Date   APPENDECTOMY     AUGMENTATION MAMMAPLASTY  1975   CATARACT EXTRACTION     Dr. Delana Meyer   CYST EXCISION Left 04/2015   ear    CYST EXCISION Left 09/2015   Shoulder   DILATION AND CURETTAGE OF UTERUS  1993   EYE SURGERY  11/02/14   FOOT SURGERY     OTHER SURGICAL HISTORY  3/07   excision fo cx granuloma tissue under anesthesia   PELVIC LAPAROSCOPY  3/96   due to pelvic pain   TONSILLECTOMY AND ADENOIDECTOMY      Current Outpatient Medications  Medication Sig Dispense Refill   Ascorbic Acid (VITAMIN C PO) Take by mouth.     cholecalciferol (VITAMIN D) 1000 UNITS tablet Take 1,000 Units by mouth daily.     Coenzyme Q10 (CO Q 10) 100 MG CAPS Take by mouth daily.     estradiol (ESTRACE) 0.5 MG tablet TAKE 1 TABLET BY MOUTH  DAILY 90 tablet 3   fluticasone (FLONASE) 50 MCG/ACT nasal spray Place 1 spray into both nostrils daily. 17 g 5   medroxyPROGESTERone (PROVERA) 2.5 MG tablet TAKE 1 TABLET BY MOUTH  DAILY 90 tablet 3   Multiple Vitamin (MULTIVITAMIN) tablet Take  1 tablet by mouth daily.     rosuvastatin (CRESTOR) 5 MG tablet Take 1 tablet by mouth daily.     famotidine (PEPCID) 40 MG tablet Take 1 tablet (40 mg total) by mouth at bedtime. 30 tablet 5   ipratropium (ATROVENT) 0.06 % nasal spray Place 2 sprays into both nostrils in the morning, at noon, in the evening, and at bedtime. 15 mL 5   omeprazole (PRILOSEC) 40 MG capsule Take 1 capsule (40 mg total) by mouth daily. 30 capsule 5   No current facility-administered medications for this visit.    Family History  Problem Relation Age of Onset   Dementia Mother     Review of Systems  All other systems reviewed and are negative.   Exam:   BP 129/60 (BP Location: Left Arm, Patient Position: Sitting, Cuff Size: Normal)   Pulse 79   Ht 5' 2.5" (1.588 m) Comment: reported  Wt 125 lb 9.6 oz (57 kg)   LMP 07/24/1991   BMI 22.61 kg/m   Height: 5' 2.5" (158.8 cm) (reported)  General appearance: alert, cooperative and appears stated age Breasts: normal appearance, no masses or tenderness Abdomen: soft, non-tender; bowel sounds  normal; no masses,  no organomegaly Lymph nodes: Cervical, supraclavicular, and axillary nodes normal.  No abnormal inguinal nodes palpated Neurologic: Grossly normal  Pelvic: External genitalia:  no lesions              Urethra:  normal appearing urethra with no masses, tenderness or lesions              Bartholins and Skenes: normal                 Vagina: normal appearing vagina with atrophic changes and no discharge, no lesions              Cervix: no lesions              Pap taken: Yes.   Bimanual Exam:  Uterus:  normal size, contour, position, consistency, mobility, non-tender              Adnexa: normal adnexa and no mass, fullness, tenderness               Rectovaginal: Confirms               Anus:  normal sphincter tone, no lesions  Chaperone, Octaviano Batty, CMA, was present for exam.  Assessment/Plan: There are no diagnoses linked to this encounter.

## 2022-01-12 LAB — CYTOLOGY - PAP: Diagnosis: NEGATIVE

## 2022-01-12 LAB — CA 125: Cancer Antigen (CA) 125: 19.3 U/mL (ref 0.0–38.1)

## 2022-01-15 ENCOUNTER — Ambulatory Visit: Payer: Medicare Other | Admitting: Family

## 2022-02-13 DIAGNOSIS — D1801 Hemangioma of skin and subcutaneous tissue: Secondary | ICD-10-CM | POA: Diagnosis not present

## 2022-02-13 DIAGNOSIS — L720 Epidermal cyst: Secondary | ICD-10-CM | POA: Diagnosis not present

## 2022-02-13 DIAGNOSIS — L821 Other seborrheic keratosis: Secondary | ICD-10-CM | POA: Diagnosis not present

## 2022-02-13 DIAGNOSIS — L820 Inflamed seborrheic keratosis: Secondary | ICD-10-CM | POA: Diagnosis not present

## 2022-03-05 ENCOUNTER — Other Ambulatory Visit (HOSPITAL_BASED_OUTPATIENT_CLINIC_OR_DEPARTMENT_OTHER): Payer: Self-pay | Admitting: *Deleted

## 2022-03-05 ENCOUNTER — Other Ambulatory Visit: Payer: Self-pay | Admitting: Obstetrics & Gynecology

## 2022-03-05 DIAGNOSIS — Z79890 Hormone replacement therapy (postmenopausal): Secondary | ICD-10-CM

## 2022-03-23 ENCOUNTER — Ambulatory Visit
Admission: RE | Admit: 2022-03-23 | Discharge: 2022-03-23 | Disposition: A | Discharge status: Home / Self Care | Payer: Medicare Other | Source: Ambulatory Visit | Attending: Internal Medicine | Admitting: Internal Medicine

## 2022-03-23 DIAGNOSIS — M810 Age-related osteoporosis without current pathological fracture: Secondary | ICD-10-CM | POA: Diagnosis not present

## 2022-03-23 DIAGNOSIS — M85851 Other specified disorders of bone density and structure, right thigh: Secondary | ICD-10-CM | POA: Diagnosis not present

## 2022-03-23 DIAGNOSIS — M85859 Other specified disorders of bone density and structure, unspecified thigh: Secondary | ICD-10-CM

## 2022-03-23 DIAGNOSIS — Z780 Asymptomatic menopausal state: Secondary | ICD-10-CM | POA: Diagnosis not present

## 2022-03-28 DIAGNOSIS — H353131 Nonexudative age-related macular degeneration, bilateral, early dry stage: Secondary | ICD-10-CM | POA: Diagnosis not present

## 2022-03-28 DIAGNOSIS — Z961 Presence of intraocular lens: Secondary | ICD-10-CM | POA: Diagnosis not present

## 2022-03-28 DIAGNOSIS — H5213 Myopia, bilateral: Secondary | ICD-10-CM | POA: Diagnosis not present

## 2022-04-09 DIAGNOSIS — R0989 Other specified symptoms and signs involving the circulatory and respiratory systems: Secondary | ICD-10-CM | POA: Diagnosis not present

## 2022-04-09 DIAGNOSIS — K219 Gastro-esophageal reflux disease without esophagitis: Secondary | ICD-10-CM | POA: Diagnosis not present

## 2022-04-12 DIAGNOSIS — R194 Change in bowel habit: Secondary | ICD-10-CM | POA: Diagnosis not present

## 2022-04-12 DIAGNOSIS — R110 Nausea: Secondary | ICD-10-CM | POA: Diagnosis not present

## 2022-05-14 ENCOUNTER — Encounter: Payer: Self-pay | Admitting: *Deleted

## 2022-05-15 DIAGNOSIS — Z0000 Encounter for general adult medical examination without abnormal findings: Secondary | ICD-10-CM | POA: Diagnosis not present

## 2022-05-15 DIAGNOSIS — R7309 Other abnormal glucose: Secondary | ICD-10-CM | POA: Diagnosis not present

## 2022-05-15 DIAGNOSIS — R7303 Prediabetes: Secondary | ICD-10-CM | POA: Diagnosis not present

## 2022-05-15 DIAGNOSIS — L659 Nonscarring hair loss, unspecified: Secondary | ICD-10-CM | POA: Diagnosis not present

## 2022-05-15 DIAGNOSIS — E559 Vitamin D deficiency, unspecified: Secondary | ICD-10-CM | POA: Diagnosis not present

## 2022-05-15 DIAGNOSIS — E785 Hyperlipidemia, unspecified: Secondary | ICD-10-CM | POA: Diagnosis not present

## 2022-05-15 DIAGNOSIS — Z23 Encounter for immunization: Secondary | ICD-10-CM | POA: Diagnosis not present

## 2022-05-15 DIAGNOSIS — M810 Age-related osteoporosis without current pathological fracture: Secondary | ICD-10-CM | POA: Diagnosis not present

## 2022-05-15 DIAGNOSIS — Z1331 Encounter for screening for depression: Secondary | ICD-10-CM | POA: Diagnosis not present

## 2022-05-15 DIAGNOSIS — Z86010 Personal history of colonic polyps: Secondary | ICD-10-CM | POA: Diagnosis not present

## 2022-05-22 DIAGNOSIS — Z23 Encounter for immunization: Secondary | ICD-10-CM | POA: Diagnosis not present

## 2022-07-09 ENCOUNTER — Other Ambulatory Visit: Payer: Self-pay | Admitting: Obstetrics & Gynecology

## 2022-07-09 DIAGNOSIS — Z1231 Encounter for screening mammogram for malignant neoplasm of breast: Secondary | ICD-10-CM

## 2022-08-22 DIAGNOSIS — R202 Paresthesia of skin: Secondary | ICD-10-CM | POA: Diagnosis not present

## 2022-08-24 DIAGNOSIS — M5416 Radiculopathy, lumbar region: Secondary | ICD-10-CM | POA: Diagnosis not present

## 2022-08-29 DIAGNOSIS — M5136 Other intervertebral disc degeneration, lumbar region: Secondary | ICD-10-CM | POA: Diagnosis not present

## 2022-08-29 DIAGNOSIS — M5126 Other intervertebral disc displacement, lumbar region: Secondary | ICD-10-CM | POA: Diagnosis not present

## 2022-08-29 DIAGNOSIS — M47816 Spondylosis without myelopathy or radiculopathy, lumbar region: Secondary | ICD-10-CM | POA: Diagnosis not present

## 2022-08-29 DIAGNOSIS — M5416 Radiculopathy, lumbar region: Secondary | ICD-10-CM | POA: Diagnosis not present

## 2022-08-30 DIAGNOSIS — M5416 Radiculopathy, lumbar region: Secondary | ICD-10-CM | POA: Diagnosis not present

## 2022-09-03 ENCOUNTER — Ambulatory Visit
Admission: RE | Admit: 2022-09-03 | Discharge: 2022-09-03 | Disposition: A | Discharge status: Home / Self Care | Payer: Medicare Other | Source: Ambulatory Visit | Attending: Obstetrics & Gynecology | Admitting: Obstetrics & Gynecology

## 2022-09-03 DIAGNOSIS — Z1231 Encounter for screening mammogram for malignant neoplasm of breast: Secondary | ICD-10-CM

## 2022-09-06 DIAGNOSIS — M5416 Radiculopathy, lumbar region: Secondary | ICD-10-CM | POA: Diagnosis not present

## 2022-09-06 DIAGNOSIS — G588 Other specified mononeuropathies: Secondary | ICD-10-CM | POA: Diagnosis not present

## 2022-09-17 DIAGNOSIS — L720 Epidermal cyst: Secondary | ICD-10-CM | POA: Diagnosis not present

## 2022-09-17 DIAGNOSIS — L820 Inflamed seborrheic keratosis: Secondary | ICD-10-CM | POA: Diagnosis not present

## 2022-09-17 DIAGNOSIS — L821 Other seborrheic keratosis: Secondary | ICD-10-CM | POA: Diagnosis not present

## 2022-10-03 DIAGNOSIS — M5416 Radiculopathy, lumbar region: Secondary | ICD-10-CM | POA: Diagnosis not present

## 2022-10-04 DIAGNOSIS — M5416 Radiculopathy, lumbar region: Secondary | ICD-10-CM | POA: Diagnosis not present

## 2022-10-09 ENCOUNTER — Other Ambulatory Visit (HOSPITAL_BASED_OUTPATIENT_CLINIC_OR_DEPARTMENT_OTHER): Payer: Self-pay | Admitting: Obstetrics & Gynecology

## 2022-10-09 DIAGNOSIS — Z79890 Hormone replacement therapy (postmenopausal): Secondary | ICD-10-CM

## 2022-10-11 DIAGNOSIS — M5416 Radiculopathy, lumbar region: Secondary | ICD-10-CM | POA: Diagnosis not present

## 2022-10-12 ENCOUNTER — Other Ambulatory Visit (HOSPITAL_BASED_OUTPATIENT_CLINIC_OR_DEPARTMENT_OTHER): Payer: Self-pay | Admitting: *Deleted

## 2022-10-12 DIAGNOSIS — Z79890 Hormone replacement therapy (postmenopausal): Secondary | ICD-10-CM

## 2022-10-12 MED ORDER — ESTRADIOL 0.5 MG PO TABS: 0.5000 mg | ORAL_TABLET | Freq: Every day | ORAL | 1 refills | Status: DC

## 2022-10-18 DIAGNOSIS — M5416 Radiculopathy, lumbar region: Secondary | ICD-10-CM | POA: Diagnosis not present

## 2022-10-25 DIAGNOSIS — M5416 Radiculopathy, lumbar region: Secondary | ICD-10-CM | POA: Diagnosis not present

## 2022-10-26 DIAGNOSIS — M792 Neuralgia and neuritis, unspecified: Secondary | ICD-10-CM | POA: Diagnosis not present

## 2022-10-26 DIAGNOSIS — J3489 Other specified disorders of nose and nasal sinuses: Secondary | ICD-10-CM | POA: Diagnosis not present

## 2022-10-26 DIAGNOSIS — M810 Age-related osteoporosis without current pathological fracture: Secondary | ICD-10-CM | POA: Diagnosis not present

## 2022-10-26 DIAGNOSIS — R7303 Prediabetes: Secondary | ICD-10-CM | POA: Diagnosis not present

## 2022-10-26 DIAGNOSIS — E559 Vitamin D deficiency, unspecified: Secondary | ICD-10-CM | POA: Diagnosis not present

## 2022-10-26 DIAGNOSIS — E785 Hyperlipidemia, unspecified: Secondary | ICD-10-CM | POA: Diagnosis not present

## 2023-01-03 DIAGNOSIS — H6123 Impacted cerumen, bilateral: Secondary | ICD-10-CM | POA: Diagnosis not present

## 2023-02-14 ENCOUNTER — Ambulatory Visit (INDEPENDENT_AMBULATORY_CARE_PROVIDER_SITE_OTHER): Payer: Medicare Other | Admitting: Obstetrics & Gynecology

## 2023-02-14 ENCOUNTER — Encounter (HOSPITAL_BASED_OUTPATIENT_CLINIC_OR_DEPARTMENT_OTHER): Payer: Self-pay | Admitting: Obstetrics & Gynecology

## 2023-02-14 VITALS — BP 149/64 | HR 69 | Ht 61.0 in | Wt 156.0 lb

## 2023-02-14 DIAGNOSIS — Z01411 Encounter for gynecological examination (general) (routine) with abnormal findings: Secondary | ICD-10-CM

## 2023-02-14 DIAGNOSIS — R232 Flushing: Secondary | ICD-10-CM

## 2023-02-14 DIAGNOSIS — Z79890 Hormone replacement therapy (postmenopausal): Secondary | ICD-10-CM

## 2023-02-14 DIAGNOSIS — F32A Depression, unspecified: Secondary | ICD-10-CM | POA: Diagnosis not present

## 2023-02-14 MED ORDER — MEDROXYPROGESTERONE ACETATE 2.5 MG PO TABS: 2.5000 mg | ORAL_TABLET | Freq: Every day | ORAL | 3 refills | Status: DC

## 2023-02-14 MED ORDER — GABAPENTIN 100 MG PO CAPS: 100.0000 mg | ORAL_CAPSULE | Freq: Every day | ORAL | 3 refills | Status: AC

## 2023-02-14 MED ORDER — SERTRALINE HCL 50 MG PO TABS: 50.0000 mg | ORAL_TABLET | Freq: Every day | ORAL | 1 refills | Status: DC

## 2023-02-14 MED ORDER — ESTRADIOL 0.5 MG PO TABS: 0.5000 mg | ORAL_TABLET | Freq: Every day | ORAL | 3 refills | Status: DC

## 2023-02-14 NOTE — Progress Notes (Signed)
77 y.o. G0P0 Married White or Caucasian female here for breast and pelvic exam.  I am also following her for HRT use.  She has tried to stop this in the past and wants to continue HRT.  Denies vaginal bleeding.  Has chronic insomnia.  Using gabapentin.  Has used 300mg  dosing but doesn't always feel she needs this.  Will give 100mg  dosage so she can titrate up or down as needed.  Pt reports she is struggling with just feeing apathetic.  Just not interested in doing things.  She does not have any particular stressors going on.  PHQ done today.  Feel treatment will help.  Pt open to this.  Flowsheet Row Office Visit from 02/14/2023 in [facility]  PHQ-9 Total Score 11        Patient's last menstrual period was 07/24/1991.          Sexually active: No.  H/O STD:  no  Health Maintenance: PCP:  Dr. Karna Dupes.  Did blood work this year at [facility] Vaccines are up to date:  yes Colonoscopy:  08/26/2018 MMG:  09/03/2022 negative BMD:  03/23/2022 Last pap smear:  01/11/2022 Negative.   H/o abnormal pap smear:      reports that she has never smoked. She has never used smokeless tobacco. She reports that she does not drink alcohol and does not use drugs.  Past Medical History:  Diagnosis Date   Endometriosis 3/96   bx neg, suspicious for endo   Frozen shoulder 2010   Infertility, female    Menopause    Ulcer     Past Surgical History:  Procedure Laterality Date   APPENDECTOMY     AUGMENTATION MAMMAPLASTY  1975   CATARACT EXTRACTION     Dr. Sherilyn Dacosta   CYST EXCISION Left 04/2015   ear    CYST EXCISION Left 09/2015   Shoulder   DILATION AND CURETTAGE OF UTERUS  1993   EYE SURGERY  11/02/14   FOOT SURGERY     OTHER SURGICAL HISTORY  3/07   excision fo cx granuloma tissue under anesthesia   PELVIC LAPAROSCOPY  3/96   due to pelvic pain   TONSILLECTOMY AND ADENOIDECTOMY      Current Outpatient Medications  Medication  Sig Dispense Refill   Ascorbic Acid (VITAMIN C PO) Take by mouth.     cholecalciferol (VITAMIN D) 1000 UNITS tablet Take 1,000 Units by mouth daily.     clindamycin (CLEOCIN T) 1 % external solution Apply topically 2 (two) times daily. Apply to affected area.  Use until lesion has resolved. 60 mL 0   Coenzyme Q10 (CO Q 10) 100 MG CAPS Take by mouth daily.     fluticasone (FLONASE) 50 MCG/ACT nasal spray Place 1 spray into both nostrils daily. 17 g 5   Multiple Vitamin (MULTIVITAMIN) tablet Take 1 tablet by mouth daily.     rosuvastatin (CRESTOR) 5 MG tablet Take 1 tablet by mouth daily.     sertraline (ZOLOFT) 50 MG tablet Take 1 tablet (50 mg total) by mouth daily. 30 tablet 1   estradiol (ESTRACE) 0.5 MG tablet Take 1 tablet (0.5 mg total) by mouth daily. 90 tablet 3   gabapentin (NEURONTIN) 100 MG capsule Take 1 capsule (100 mg total) by mouth at bedtime. Take 1- 3 capsules nightly as needed for insomnia or hot flashes. 270 capsule 3   medroxyPROGESTERone (PROVERA) 2.5 MG tablet Take 1 tablet (2.5 mg total) by  mouth daily. 90 tablet 3   No current facility-administered medications for this visit.    Family History  Problem Relation Age of Onset   Dementia Mother     Review of Systems  Constitutional: Negative.   Genitourinary: Negative.   Psychiatric/Behavioral:  Positive for dysphoric mood.     Exam:   BP (!) 149/64 (BP Location: Right Arm, Patient Position: Sitting, Cuff Size: Normal)   Pulse 69   Ht 5\' 1"  (1.549 m) Comment: Reported  Wt 156 lb (70.8 kg)   LMP 07/24/1991   BMI 29.48 kg/m   Height: 5\' 1"  (154.9 cm) (Reported)  General appearance: alert, cooperative and appears stated age Breasts: normal appearance, no masses or tenderness Abdomen: soft, non-tender; bowel sounds normal; no masses,  no organomegaly Lymph nodes: Cervical, supraclavicular, and axillary nodes normal.  No abnormal inguinal nodes palpated Neurologic: Grossly normal  Pelvic:  Deferred   Assessment/Plan: 1. Encntr for gyn exam (general) (routine) w abnormal findings - Pap smear 01/11/2022 - Mammogram 09/03/2022 - Colonoscopy 08/26/2022 - Bone mineral density 03/23/2022 - lab work done with PCP, Dr. Chales Salmon - vaccines reviewed/updated  2. Insomnia - gabapentin (NEURONTIN) 100 MG capsule; Take 1 capsule (100 mg total) by mouth at bedtime. Take 1- 3 capsules nightly as needed for insomnia or hot flashes.  Dispense: 270 capsule; Refill: 3  3. Hormone replacement therapy (HRT) - will continue HRT - estradiol (ESTRACE) 0.5 MG tablet; Take 1 tablet (0.5 mg total) by mouth daily.  Dispense: 90 tablet; Refill: 3 - medroxyPROGESTERone (PROVERA) 2.5 MG tablet; Take 1 tablet (2.5 mg total) by mouth daily.  Dispense: 90 tablet; Refill: 3  4. Mild depression - recommend treatment.  Will start zoloft 50mg  daily.  Recheck 4 weeks.

## 2023-02-25 ENCOUNTER — Ambulatory Visit (HOSPITAL_BASED_OUTPATIENT_CLINIC_OR_DEPARTMENT_OTHER): Payer: Medicare Other | Admitting: Obstetrics & Gynecology

## 2023-03-18 ENCOUNTER — Other Ambulatory Visit (HOSPITAL_BASED_OUTPATIENT_CLINIC_OR_DEPARTMENT_OTHER): Payer: Self-pay | Admitting: *Deleted

## 2023-03-18 MED ORDER — HYDROCORTISONE (PERIANAL) 2.5 % EX CREA: 1.0000 | TOPICAL_CREAM | CUTANEOUS | 0 refills | Status: DC

## 2023-03-20 ENCOUNTER — Ambulatory Visit (HOSPITAL_BASED_OUTPATIENT_CLINIC_OR_DEPARTMENT_OTHER): Payer: Medicare Other | Admitting: Obstetrics & Gynecology

## 2023-03-28 DIAGNOSIS — L820 Inflamed seborrheic keratosis: Secondary | ICD-10-CM | POA: Diagnosis not present

## 2023-03-28 DIAGNOSIS — L720 Epidermal cyst: Secondary | ICD-10-CM | POA: Diagnosis not present

## 2023-03-28 DIAGNOSIS — L812 Freckles: Secondary | ICD-10-CM | POA: Diagnosis not present

## 2023-03-28 DIAGNOSIS — D225 Melanocytic nevi of trunk: Secondary | ICD-10-CM | POA: Diagnosis not present

## 2023-03-28 DIAGNOSIS — D1801 Hemangioma of skin and subcutaneous tissue: Secondary | ICD-10-CM | POA: Diagnosis not present

## 2023-03-28 DIAGNOSIS — L821 Other seborrheic keratosis: Secondary | ICD-10-CM | POA: Diagnosis not present

## 2023-04-01 ENCOUNTER — Ambulatory Visit (INDEPENDENT_AMBULATORY_CARE_PROVIDER_SITE_OTHER): Payer: Medicare Other | Admitting: Obstetrics & Gynecology

## 2023-04-01 ENCOUNTER — Encounter (HOSPITAL_BASED_OUTPATIENT_CLINIC_OR_DEPARTMENT_OTHER): Payer: Self-pay | Admitting: Obstetrics & Gynecology

## 2023-04-01 VITALS — BP 138/63 | HR 74 | Ht 61.0 in | Wt 127.4 lb

## 2023-04-01 DIAGNOSIS — R1903 Right lower quadrant abdominal swelling, mass and lump: Secondary | ICD-10-CM | POA: Diagnosis not present

## 2023-04-01 DIAGNOSIS — G8929 Other chronic pain: Secondary | ICD-10-CM | POA: Diagnosis not present

## 2023-04-01 DIAGNOSIS — R1031 Right lower quadrant pain: Secondary | ICD-10-CM | POA: Diagnosis not present

## 2023-04-01 DIAGNOSIS — K649 Unspecified hemorrhoids: Secondary | ICD-10-CM | POA: Diagnosis not present

## 2023-04-01 MED ORDER — HYDROCORTISONE (PERIANAL) 2.5 % EX CREA: 1.0000 | TOPICAL_CREAM | CUTANEOUS | 1 refills | Status: DC

## 2023-04-01 NOTE — Progress Notes (Unsigned)
GYNECOLOGY  VISIT  CC:   recheck  HPI: 77 y.o. G0P0 Married White or Caucasian female here for follow up after being seen in July.  She took one and then reports she had a Sunday School that was very impactful and she decided not to take any more of the Zoloft.  She feels like this has helped her outlook.    Also called recently for medication for hemorrhoids.  Feeling about 90 % better but wants to be examined today.  Has felt two small firm lesions on inner right labia that she would like me to evaluate today.  Denies vaginal bleeding.  Has intermittent bloating with accompanying RLQ pain that has been an ongoing issue for her.   Past Medical History:  Diagnosis Date   Endometriosis 3/96   bx neg, suspicious for endo   Frozen shoulder 2010   Infertility, female    Menopause    Ulcer     MEDS:   Current Outpatient Medications on File Prior to Visit  Medication Sig Dispense Refill   Ascorbic Acid (VITAMIN C PO) Take by mouth.     cholecalciferol (VITAMIN D) 1000 UNITS tablet Take 1,000 Units by mouth daily.     clindamycin (CLEOCIN T) 1 % external solution Apply topically 2 (two) times daily. Apply to affected area.  Use until lesion has resolved. 60 mL 0   Coenzyme Q10 (CO Q 10) 100 MG CAPS Take by mouth daily.     estradiol (ESTRACE) 0.5 MG tablet Take 1 tablet (0.5 mg total) by mouth daily. 90 tablet 3   fluticasone (FLONASE) 50 MCG/ACT nasal spray Place 1 spray into both nostrils daily. 17 g 5   gabapentin (NEURONTIN) 100 MG capsule Take 1 capsule (100 mg total) by mouth at bedtime. Take 1- 3 capsules nightly as needed for insomnia or hot flashes. 270 capsule 3   hydrocortisone (ANUSOL-HC) 2.5 % rectal cream Place 1 Application rectally every 4 (four) hours. 30 g 0   medroxyPROGESTERone (PROVERA) 2.5 MG tablet Take 1 tablet (2.5 mg total) by mouth daily. 90 tablet 3   Multiple Vitamin (MULTIVITAMIN) tablet Take 1 tablet by mouth daily.     rosuvastatin (CRESTOR) 5 MG tablet  Take 1 tablet by mouth daily.     sertraline (ZOLOFT) 50 MG tablet Take 1 tablet (50 mg total) by mouth daily. (Patient not taking: Reported on 04/01/2023) 30 tablet 1   No current facility-administered medications on file prior to visit.    ALLERGIES: Demerol [meperidine hcl], Hydrocodone-acetaminophen, Pravastatin, and Simvastatin  SH:  married, non smoker  Review of Systems  Constitutional: Negative.   Psychiatric/Behavioral: Negative.      PHYSICAL EXAMINATION:    BP 138/63 (BP Location: Right Arm, Patient Position: Sitting, Cuff Size: Normal)   Pulse 74   Ht 5\' 1"  (1.549 m)   Wt 127 lb 6.4 oz (57.8 kg)   LMP 07/24/1991   BMI 24.07 kg/m     General appearance: alert, cooperative and appears stated age  Pelvic: External genitalia:small sebaceous cysts on inner right labia majora              Urethra:  normal appearing urethra with no masses, tenderness or lesions              Bartholins and Skenes: normal                 Vagina: normal appearing vagina with normal color and discharge, no lesions  Anus:  normal sphincter tone, non thrombosed hemorrhoid at 12 o'clock  Chaperone, Hendricks Milo, CMA, was present for exam.  Assessment/Plan: 1. Chronic RLQ pain  2. Right lower quadrant abdominal swelling, mass and lump - CA 125  3. Hemorrhoids, unspecified hemorrhoid type - rx for anusol [facility] RF sent to pharmacy

## 2023-04-02 LAB — CA 125: Cancer Antigen (CA) 125: 18 U/mL (ref 0.0–38.1)

## 2023-04-16 DIAGNOSIS — H353132 Nonexudative age-related macular degeneration, bilateral, intermediate dry stage: Secondary | ICD-10-CM | POA: Diagnosis not present

## 2023-04-16 DIAGNOSIS — H5213 Myopia, bilateral: Secondary | ICD-10-CM | POA: Diagnosis not present

## 2023-05-29 DIAGNOSIS — Z23 Encounter for immunization: Secondary | ICD-10-CM | POA: Diagnosis not present

## 2023-05-30 DIAGNOSIS — R7303 Prediabetes: Secondary | ICD-10-CM | POA: Diagnosis not present

## 2023-05-30 DIAGNOSIS — F5101 Primary insomnia: Secondary | ICD-10-CM | POA: Diagnosis not present

## 2023-05-30 DIAGNOSIS — E559 Vitamin D deficiency, unspecified: Secondary | ICD-10-CM | POA: Diagnosis not present

## 2023-05-30 DIAGNOSIS — M792 Neuralgia and neuritis, unspecified: Secondary | ICD-10-CM | POA: Diagnosis not present

## 2023-05-30 DIAGNOSIS — M810 Age-related osteoporosis without current pathological fracture: Secondary | ICD-10-CM | POA: Diagnosis not present

## 2023-05-30 DIAGNOSIS — I700 Atherosclerosis of aorta: Secondary | ICD-10-CM | POA: Diagnosis not present

## 2023-05-30 DIAGNOSIS — R5383 Other fatigue: Secondary | ICD-10-CM | POA: Diagnosis not present

## 2023-05-30 DIAGNOSIS — E785 Hyperlipidemia, unspecified: Secondary | ICD-10-CM | POA: Diagnosis not present

## 2023-05-30 DIAGNOSIS — Z0000 Encounter for general adult medical examination without abnormal findings: Secondary | ICD-10-CM | POA: Diagnosis not present

## 2023-05-30 DIAGNOSIS — T452X1A Poisoning by vitamins, accidental (unintentional), initial encounter: Secondary | ICD-10-CM | POA: Diagnosis not present

## 2023-05-30 DIAGNOSIS — Z1159 Encounter for screening for other viral diseases: Secondary | ICD-10-CM | POA: Diagnosis not present

## 2023-06-05 DIAGNOSIS — Z23 Encounter for immunization: Secondary | ICD-10-CM | POA: Diagnosis not present

## 2023-08-02 DIAGNOSIS — R1032 Left lower quadrant pain: Secondary | ICD-10-CM | POA: Diagnosis not present

## 2023-08-02 DIAGNOSIS — M792 Neuralgia and neuritis, unspecified: Secondary | ICD-10-CM | POA: Diagnosis not present

## 2023-08-06 DIAGNOSIS — H6123 Impacted cerumen, bilateral: Secondary | ICD-10-CM | POA: Diagnosis not present

## 2023-08-08 ENCOUNTER — Other Ambulatory Visit: Payer: Self-pay | Admitting: Obstetrics & Gynecology

## 2023-08-08 DIAGNOSIS — Z1231 Encounter for screening mammogram for malignant neoplasm of breast: Secondary | ICD-10-CM

## 2023-08-09 ENCOUNTER — Other Ambulatory Visit: Payer: Self-pay | Admitting: Family Medicine

## 2023-08-09 DIAGNOSIS — R1032 Left lower quadrant pain: Secondary | ICD-10-CM

## 2023-08-13 ENCOUNTER — Ambulatory Visit
Admission: RE | Admit: 2023-08-13 | Discharge: 2023-08-13 | Disposition: A | Discharge status: Home / Self Care | Payer: Medicare Other | Source: Ambulatory Visit | Attending: Family Medicine | Admitting: Family Medicine

## 2023-08-13 DIAGNOSIS — R1032 Left lower quadrant pain: Secondary | ICD-10-CM | POA: Diagnosis not present

## 2023-09-05 ENCOUNTER — Ambulatory Visit: Payer: Medicare Other

## 2023-09-11 ENCOUNTER — Ambulatory Visit
Admission: RE | Admit: 2023-09-11 | Discharge: 2023-09-11 | Disposition: A | Discharge status: Home / Self Care | Payer: Medicare Other | Source: Ambulatory Visit | Attending: Obstetrics & Gynecology | Admitting: Obstetrics & Gynecology

## 2023-09-11 ENCOUNTER — Ambulatory Visit: Payer: Medicare Other

## 2023-09-11 DIAGNOSIS — Z1231 Encounter for screening mammogram for malignant neoplasm of breast: Secondary | ICD-10-CM | POA: Diagnosis not present

## 2023-09-30 DIAGNOSIS — L723 Sebaceous cyst: Secondary | ICD-10-CM | POA: Diagnosis not present

## 2023-10-08 DIAGNOSIS — L720 Epidermal cyst: Secondary | ICD-10-CM | POA: Diagnosis not present

## 2023-10-15 DIAGNOSIS — R0602 Shortness of breath: Secondary | ICD-10-CM | POA: Diagnosis not present

## 2023-10-15 DIAGNOSIS — E559 Vitamin D deficiency, unspecified: Secondary | ICD-10-CM | POA: Diagnosis not present

## 2023-10-15 DIAGNOSIS — E538 Deficiency of other specified B group vitamins: Secondary | ICD-10-CM | POA: Diagnosis not present

## 2023-10-15 DIAGNOSIS — Z87898 Personal history of other specified conditions: Secondary | ICD-10-CM | POA: Diagnosis not present

## 2023-10-21 DIAGNOSIS — M810 Age-related osteoporosis without current pathological fracture: Secondary | ICD-10-CM | POA: Diagnosis not present

## 2023-10-21 DIAGNOSIS — E785 Hyperlipidemia, unspecified: Secondary | ICD-10-CM | POA: Diagnosis not present

## 2023-10-21 DIAGNOSIS — I700 Atherosclerosis of aorta: Secondary | ICD-10-CM | POA: Diagnosis not present

## 2023-11-04 ENCOUNTER — Ambulatory Visit (INDEPENDENT_AMBULATORY_CARE_PROVIDER_SITE_OTHER): Admitting: Podiatry

## 2023-11-04 ENCOUNTER — Encounter: Payer: Self-pay | Admitting: Podiatry

## 2023-11-04 VITALS — Ht 61.0 in | Wt 127.0 lb

## 2023-11-04 DIAGNOSIS — M79675 Pain in left toe(s): Secondary | ICD-10-CM

## 2023-11-04 DIAGNOSIS — Q846 Other congenital malformations of nails: Secondary | ICD-10-CM | POA: Diagnosis not present

## 2023-11-04 NOTE — Progress Notes (Unsigned)
      Chief Complaint  Patient presents with   Toe Pain    " My big toe on left foot has been sore for 2 weeks and she has pain with walking and rubbing my 2nd toe, I have tried epsom salts, hydrocortisone, polysporin, neosporin,and bactrim.    HPI: 78 y.o. female presents today with the above concern  Past Medical History:  Diagnosis Date   Endometriosis 3/96   bx neg, suspicious for endo   Frozen shoulder 2010   Infertility, female    Menopause    Ulcer     Past Surgical History:  Procedure Laterality Date   APPENDECTOMY     AUGMENTATION MAMMAPLASTY  1975   CATARACT EXTRACTION     Dr. Sherilyn Dacosta   CYST EXCISION Left 04/2015   ear    CYST EXCISION Left 09/2015   Shoulder   DILATION AND CURETTAGE OF UTERUS  1993   EYE SURGERY  11/02/14   FOOT SURGERY     OTHER SURGICAL HISTORY  3/07   excision fo cx granuloma tissue under anesthesia   PELVIC LAPAROSCOPY  3/96   due to pelvic pain   TONSILLECTOMY AND ADENOIDECTOMY      Allergies  Allergen Reactions   Demerol [Meperidine Hcl]     Uncontrollable jerking.   Hydrocodone-Acetaminophen Hives and Itching    Pt stated, "It made my heart skip a beat and made me itch"   Pravastatin Other (See Comments)   Simvastatin Other (See Comments) and Palpitations    Physical Exam: Palpable pedal pulses are noted.  There is mild incurvation of the left hallux nail along the lateral border.  There is hard skin along the lateral nail margin of the left hallux.  There is pain on palpation of this hard skin.  There is minimal inflammatory changes seen along this margin.  No signs of paronychia are noted.  There is a bony palpable prominence at the DIPJ on the medial aspect of the second toe adjacent to the area of discomfort on the hallux.  Minimal medial angulation of the second toe with the forefoot loaded.  Epicritic sensation intact  Assessment/Plan of Care: 1. Abnormality of nail tissue   2. Pain in left toe(s)    Discussed  clinical findings with patient today.  Hardened skin along the lateral margin of the left hallux was shaved with a sterile #313 blade.  Antibiotic ointment was applied.  Patient encouraged to use cuticle oil or vitamin E oil in this area to help soften up the hard skin around the nail edge.  Informed the patient that the bone at the DIPJ of the second toe is prominent medially and pushing against the hallux.  If today's treatment does not provide resolution then would next recommend avulsion of the lateral aspect of the hallux toenail to debulk the toe slightly and decrease pressure on the area.  Toe spacers were discussed as well to keep pressure off the toe.   Clerance Lav, DPM, FACFAS [facility]     [address] [place], [place] [address]                Office [phone]  Fax [phone]

## 2023-11-11 ENCOUNTER — Other Ambulatory Visit (HOSPITAL_BASED_OUTPATIENT_CLINIC_OR_DEPARTMENT_OTHER): Payer: Self-pay | Admitting: *Deleted

## 2023-11-11 DIAGNOSIS — Z79890 Hormone replacement therapy (postmenopausal): Secondary | ICD-10-CM

## 2023-11-11 MED ORDER — MEDROXYPROGESTERONE ACETATE 2.5 MG PO TABS: 2.5000 mg | ORAL_TABLET | Freq: Every day | ORAL | 0 refills | Status: DC

## 2023-11-11 MED ORDER — ESTRADIOL 0.5 MG PO TABS: 0.5000 mg | ORAL_TABLET | Freq: Every day | ORAL | 0 refills | Status: DC

## 2023-11-11 NOTE — Progress Notes (Signed)
 Pt requests refill on estradiol  and medroxyprogesterone  to [facility]. Pt has appt in August. Refills sent.

## 2023-11-15 DIAGNOSIS — R0602 Shortness of breath: Secondary | ICD-10-CM | POA: Diagnosis not present

## 2024-01-02 ENCOUNTER — Ambulatory Visit: Admitting: Pulmonary Disease

## 2024-01-02 ENCOUNTER — Encounter: Payer: Self-pay | Admitting: Pulmonary Disease

## 2024-01-02 VITALS — HR 81 | Ht 63.0 in | Wt 125.8 lb

## 2024-01-02 DIAGNOSIS — R0602 Shortness of breath: Secondary | ICD-10-CM

## 2024-01-02 DIAGNOSIS — E785 Hyperlipidemia, unspecified: Secondary | ICD-10-CM | POA: Diagnosis not present

## 2024-01-02 NOTE — Patient Instructions (Signed)
 I will see you back here as needed  Graded exercises -Exercises to strengthen lower extremity muscles - Step up exercises - Squats if you can  Follow-up with cardiology regarding your echocardiogram - I could not pull it up in your records  We can consider a breathing study if you are still having problems despite working on your strength  I think you do a lot of exercises already and the demand on your cardiorespiratory system is the same aside from that you are using different muscles in climbing which you can try to strengthen  Call us  with significant concerns

## 2024-01-02 NOTE — Progress Notes (Signed)
 Alexandra Cain    [id number]    11/09/1945  Primary Care Physician:Hagler, Kayleen Party, MD  Referring Physician: Dorena Gander, MD [phone] [place],  [place] [address]  Chief complaint:   Shortness of breath  HPI:  Shortness of breath only usually when walking up a grade  She exercises regularly uses aerodyne bike  [place] on a regular basis Will only gets short of breath when she walking up a grade  No wheezing, no shortness of breath at any other time  No history of any lung disease  Never smoker  Had a CT previously showing a small lung nodule that was followed up  Did have an echocardiogram recently, I could not pull up the result, has referral to see a cardiologist   Outpatient Encounter Medications as of 01/02/2024  Medication Sig   Coenzyme Q10 (CO Q 10) 100 MG CAPS Take by mouth daily.   estradiol  (ESTRACE ) 0.5 MG tablet Take 1 tablet (0.5 mg total) by mouth daily.   fluticasone  (FLONASE ) 50 MCG/ACT nasal spray Place 1 spray into both nostrils daily.   gabapentin  (NEURONTIN ) 100 MG capsule Take 1 capsule (100 mg total) by mouth at bedtime. Take 1- 3 capsules nightly as needed for insomnia or hot flashes.   medroxyPROGESTERone  (PROVERA ) 2.5 MG tablet Take 1 tablet (2.5 mg total) by mouth daily.   Multiple Vitamin (MULTIVITAMIN) tablet Take 1 tablet by mouth daily.   rosuvastatin (CRESTOR) 5 MG tablet Take 1 tablet by mouth daily.   hydrocortisone  (ANUSOL -HC) 2.5 % rectal cream Place 1 Application rectally every 4 (four) hours. (Patient not taking: Reported on 01/02/2024)   sertraline  (ZOLOFT ) 50 MG tablet Take 1 tablet (50 mg total) by mouth daily. (Patient not taking: Reported on 01/02/2024)   No facility-administered encounter medications on file as of 01/02/2024.    Allergies as of 01/02/2024 - Review Complete 01/02/2024  Allergen Reaction Noted   Demerol  [meperidine  hcl]  04/02/2019   Hydrocodone -acetaminophen  Hives and Itching 09/26/2017    Pravastatin Other (See Comments) 09/25/2021   Simvastatin Other (See Comments) and Palpitations 09/25/2021    Past Medical History:  Diagnosis Date   Atherosclerosis of aorta (HCC)    Chronic abdominal pain    Chronic nausea    COVID    Endometriosis 09/21/1994   bx neg, suspicious for endo   Frozen shoulder 07/23/2008   Hx of adenomatous colonic polyps    Infertility, female    Lumbar radiculopathy    Menopause    Osteopenia    Ulcer     Past Surgical History:  Procedure Laterality Date   APPENDECTOMY     AUGMENTATION MAMMAPLASTY  1975   CATARACT EXTRACTION     Dr. Michaeline Adolf   CYST EXCISION Left 04/2015   ear    CYST EXCISION Left 09/2015   Shoulder   DILATION AND CURETTAGE OF UTERUS  1993   EYE SURGERY  11/02/14   FOOT SURGERY     OTHER SURGICAL HISTORY  3/07   excision fo cx granuloma tissue under anesthesia   PELVIC LAPAROSCOPY  3/96   due to pelvic pain   TONSILLECTOMY AND ADENOIDECTOMY      Family History  Problem Relation Age of Onset   Dementia Mother    Alcoholism Brother    Breast cancer Neg Hx     Social History   Socioeconomic History   Marital status: Married    Spouse name: Not on  file   Number of children: Not on file   Years of education: Not on file   Highest education level: Not on file  Occupational History   Not on file  Tobacco Use   Smoking status: Never   Smokeless tobacco: Never  Vaping Use   Vaping status: Never Used  Substance and Sexual Activity   Alcohol use: No   Drug use: No   Sexual activity: Yes    Partners: Male    Birth control/protection: Post-menopausal, None  Other Topics Concern   Not on file  Social History Narrative   Not on file   Social Drivers of Health   Financial Resource Strain: Not on file  Food Insecurity: Not on file  Transportation Needs: Not on file  Physical Activity: Not on file  Stress: Not on file  Social Connections: Not on file  Intimate Partner Violence: Not on file     Review of Systems  Respiratory:  Positive for shortness of breath.     Vitals:   01/02/24 1255  Pulse: 81  SpO2: 94%     Physical Exam Constitutional:      Appearance: Normal appearance.  HENT:     Head: Normocephalic.     Mouth/Throat:     Mouth: Mucous membranes are moist.   Eyes:     General: No scleral icterus.   Cardiovascular:     Rate and Rhythm: Normal rate and regular rhythm.     Heart sounds: No murmur heard.    No friction rub.  Pulmonary:     Effort: No respiratory distress.     Breath sounds: No stridor. No wheezing or rhonchi.   Musculoskeletal:     Cervical back: No rigidity or tenderness.   Neurological:     Mental Status: She is alert.   Psychiatric:        Mood and Affect: Mood normal.    Data Reviewed: CT scan from 2017 reviewed showing a right midlung 5 mm nodule stable compared to 2016  Recent echocardiogram-results not available  Assessment:  Shortness of breath only when walking up a grade  Exercises regularly-uses aerodyne bike-up to 20 miles sometimes, walks her dog about 2 miles a day-usually without significant shortness of breath  She does have some hip pain-does not feel this is contributing to the shortness of breath walking up a grade as she does not have any symptoms specific pain when walking  Hyperlipidemia  Plan/Recommendations: Discussed the option of getting a pulmonary function test  Follow-up with echocardiogram  Specific exercises to strengthen lower extremity muscles, muscles engaged with incline walking-squats if able, stepstool exercise if able   Follow-up as needed   Myer Artis MD [facility] 01/02/2024, 1:05 PM  CC: Dorena Gander, MD

## 2024-01-15 ENCOUNTER — Ambulatory Visit: Attending: Cardiology | Admitting: Cardiology

## 2024-01-15 ENCOUNTER — Encounter: Payer: Self-pay | Admitting: Cardiology

## 2024-01-15 VITALS — BP 110/54 | HR 95 | Ht 62.5 in | Wt 126.0 lb

## 2024-01-15 DIAGNOSIS — R0602 Shortness of breath: Secondary | ICD-10-CM | POA: Diagnosis not present

## 2024-01-15 DIAGNOSIS — R072 Precordial pain: Secondary | ICD-10-CM | POA: Insufficient documentation

## 2024-01-15 DIAGNOSIS — R0609 Other forms of dyspnea: Secondary | ICD-10-CM | POA: Insufficient documentation

## 2024-01-15 MED ORDER — METOPROLOL TARTRATE 100 MG PO TABS: 100.0000 mg | ORAL_TABLET | Freq: Once | ORAL | 0 refills | Status: DC

## 2024-01-15 NOTE — Progress Notes (Unsigned)
 Cardiology Office Note:  .   Date:  01/16/2024  ID:  Alexandra Cain, DOB May 17, 1946, MRN [id number] PCP: Rolinda Millman, MD  [facility] Providers Cardiologist:  Alexandra Clay, MD     Chief Complaint  Patient presents with   New Patient (Initial Visit)    Evaluation of exertional dyspnea    Patient Profile: .     Alexandra Cain is a relatively healthy 78 y.o. female  with a PMH notable for hyperlipidemia and aortic atherosclerosis as well as peripheral neuropathy who presents here for Evaluation of Exertional Dyspnea at the request of Rolinda Millman, MD.  Pertinent PMH includes aortic atherosclerosis and hyperlipidemia     Alexandra Cain was last seen on January 02, 2024 by Dr. Neda from pulmonary medicine: She noted exertional dyspnea walking up an incline.  Uses an aerodyne bike to exercise.  Walks on a regular basis.  No coughing or wheezing.  Never smoked.  As part of evaluation was referred to pulmonary medicine as well as cardiology.  She apparently had an echocardiogram done, however the results were not forwarded.  Subjective  Discussed the use of AI scribe software for clinical note transcription with the patient, who gave verbal consent to proceed.  History of Present Illness History of Present Illness Alexandra Cain is a 78 year old female who presents with exertional dyspnea. She was referred by Dr. Glena for evaluation of exertional dyspnea.  She experiences shortness of breath specifically when walking uphill, but not on flat ground or during routine activities. There is no associated chest tightness, pressure, or pain. She describes the sensation as 'just huffing' and finds that resting briefly or zigzagging allows her to continue walking. No shortness of breath at rest, when lying flat, or waking up short of breath. Her watch indicates an increased heart rate during these episodes.  She denies any real chest pain but she does occasionally feel maybe some mild pressure  when she is having a hard time breathing while going uphill if she does not stop to rest.  However she denies any discomfort with routine activity.  No heart racing, skipping, or flipping sensations during these episodes.  She just knows that her heart rate is usually faster, which makes sense since she is usually active.  She has a history of pulmonary nodules identified approximately ten to fifteen years ago, but no recent chest CT scans have been performed. No recent coughing, wheezing, or postnasal drip, although she mentions her nose runs all the time, which is not new.     Objective   She is currently taking rosuvastatin 5 mg for cholesterol management and does not take any blood pressure medications.   Family History is significant for her maternal grandfather who died of a heart attack at the age of 15. - Her mother died at 33 of dementia, father died at age 31 in an airplane crash; 1 brother died at 2 died from heart attack but was alcoholic.  Studies Reviewed: Alexandra Cain   EKG Interpretation Date/Time:  Wednesday January 15 2024 08:15:51 EDT Ventricular Rate:  95 PR Interval:  146 QRS Duration:  86 QT Interval:  352 QTC Calculation: 442 R Axis:   80  Text Interpretation: Normal sinus rhythm Nonspecific ST abnormality When compared with ECG of 24-Sep-2005 15:44, Vent. rate has increased BY  31 BPM Nonspecific T wave abnormality now evident in Inferior leads QT has lengthened Confirmed by Cain Alexandra ([id number]) on 01/15/2024 8:26:41 AM  Echo result reviewed after the patient left ECHO: Normal LV size and function.  EF 55 to 60%.  No RWMA.  Normal diastolic function.  No pulmonary pretension (11/15/2023)  Labs from PCP 08/02/2023: CBC W5.5, H/H14.4/42.3; PLT 199.  Latest lipid panel recorded via [facility] was 05/15/2022: TC 172, TG 102, HDL 70, LDL 84. 10/15/2023: TSH 1.69;  Risk Assessment/Calculations:              Physical Exam:   VS:  BP (!) 110/54   Pulse 95   Ht 5' 2.5 (1.588 m)    Wt 126 lb (57.2 kg)   LMP 07/24/1991   SpO2 98%   BMI 22.68 kg/m    Wt Readings from Last 3 Encounters:  01/15/24 126 lb (57.2 kg)  01/02/24 125 lb 12.8 oz (57.1 kg)  11/04/23 127 lb (57.6 kg)    GEN: Well nourished, well developed in no acute distress; groomed. NECK: No JVD; No carotid bruits CARDIAC: Normal S1, S2; RRR, no murmurs, rubs, gallops RESPIRATORY:  Clear to auscultation without rales, wheezing or rhonchi ; nonlabored, good air movement. ABDOMEN: Soft, non-tender, non-distended EXTREMITIES:  No edema; No deformity     ASSESSMENT AND PLAN: .    Problem List Items Addressed This Visit     Dyspnea on exertion   Progressive exertional dyspnea Progressive exertional dyspnea primarily when walking uphill. Differential includes cardiac causes such as coronary artery disease and non-cardiac causes like deconditioning or pulmonary issues.  Echocardiogram pending at the time of our clinic visit, however the lack of any input from ordering physician was suggested it was relatively normal..  - Echocardiogram results were reviewed-noted above.  Essentially normal as expected.  Coronary CT angiography considered for accurate assessment of coronary stenoses - Order Coronary CT Angiogram. - Schedule follow-up to discuss results.      Relevant Orders   CT CORONARY MORPH W/CTA COR W/SCORE W/CA W/CM &/OR WO/CM   Precordial pain   Rare but occasionally notable sense of tightness with dyspnea while walking uphill and not stopping.  With normal echocardiogram and no other sequelae of symptoms to suggest acute pulmonary etiology, we discussed options for evaluation of CAD including Myoview, cardiac stress test versus coronary CTA. Given that we now have him results of an echocardiogram, and the importance of cardiac function from either PET for Myoview becomes less important and then more effective evaluation Coronary CTA will help exclude obstructive CAD.  If stress testing will be  desired in the future, would probably consider CPX.      Relevant Orders   CT CORONARY MORPH W/CTA COR W/SCORE W/CA W/CM &/OR WO/CM   Other Visit Diagnoses       DOE (dyspnea on exertion)    -  Primary   Relevant Orders   EKG 12-Lead (Completed)   CT CORONARY MORPH W/CTA COR W/SCORE W/CA W/CM &/OR WO/CM            Follow-Up: Return in about 2 months (around 03/16/2024) for To discuss test results, [employer].     Signed, Alexandra MICAEL Clay, MD, MS Alexandra Cain, M.D., M.S. Interventional Chartered certified accountant  Pager # [phone]

## 2024-01-15 NOTE — Patient Instructions (Addendum)
 Medication Instructions:   ONE TIME USE - METOPROLOL TARTRATE  100 MG *If you need a refill on your cardiac medications before your next appointment, please call your pharmacy*   Lab Work  NOT NEEDED    Testing/Procedures:  Your physician has requested that you have coronary  CTA. Coronary computed tomography (CT)angiogram  is a special type of CT scan that uses a computer to produce multi-dimensional views of major blood vessels throughout the heart.  CT angiography, a contrast material is injected through an IV to help visualize the blood vessels  a painless test that uses an x-ray machine to take clear, detailed pictures of your heart arteries .  Please follow instruction sheet as given.   Follow-Up: At [facility], you and your health needs are our priority.  As part of our continuing mission to provide you with exceptional heart care, we have created designated Provider Care Teams.  These Care Teams include your primary Cardiologist (physician) and Advanced Practice Providers (APPs -  Physician Assistants and Nurse Practitioners) who all work together to provide you with the care you need, when you need it.     Your next appointment:   2 month(s)  The format for your next appointment:   In Person  Provider:   Alm Clay, MD   Other Instructions    Your cardiac CT will be scheduled at one of the below locations:   [facility] [address]  [place], [place] [address]     All radiology patients and guests should use entrance C2 at [facility], accessed from [facility], even though the hospital's physical address listed is [address].    If scheduled at the Heart and Vascular Tower at [address], please enter the parking lot using the [address] entrance and use the FREE valet service at the patient drop-off area. Enter the buidling and check-in with registration on  the main floor.  If scheduled at [facility], please arrive 30 minutes early for check-in and test prep.  Please follow these instructions carefully (unless otherwise directed):  An IV will be required for this test and Nitroglycerin will be given.    On the Night Before the Test: Be sure to Drink plenty of water. Do not consume any caffeinated/decaffeinated beverages or chocolate 12 hours prior to your test. Do not take any antihistamines 12 hours prior to your test. .  On the Day of the Test: Drink plenty of water until 1 hour prior to the test. Do not eat any food 1 hour prior to test. You may take your regular medications prior to the test.  Take metoprolol (Lopressor) two hours prior to test.. FEMALES- please wear underwire-free bra if available, avoid dresses & tight clothing         After the Test: Drink plenty of water. After receiving IV contrast, you may experience a mild flushed feeling. This is normal. On occasion, you may experience a mild rash up to 24 hours after the test. This is not dangerous. If this occurs, you can take Benadryl 25 mg, Zyrtec, Claritin, or Allegra and increase your fluid intake. (Patients taking Tikosyn should avoid Benadryl, and may take Zyrtec, Claritin, or Allegra) If you experience trouble breathing, this can be serious. If it is severe call 911 IMMEDIATELY. If it is mild, please call our office.  We will call to schedule your test 2-4 weeks out understanding  that some insurance companies will need an authorization prior to the service being performed.   For more information and frequently asked questions, please visit our website : [email or web address]  For non-scheduling related questions, please contact the cardiac imaging nurse navigator should you have any questions/concerns: Cardiac Imaging Nurse Navigators Direct Office Dial: [phone]   For scheduling needs, including cancellations and rescheduling, please  call [place], [phone].

## 2024-01-16 ENCOUNTER — Encounter: Payer: Self-pay | Admitting: Cardiology

## 2024-01-16 DIAGNOSIS — R072 Precordial pain: Secondary | ICD-10-CM | POA: Insufficient documentation

## 2024-01-16 DIAGNOSIS — M25512 Pain in left shoulder: Secondary | ICD-10-CM | POA: Diagnosis not present

## 2024-01-16 DIAGNOSIS — M7062 Trochanteric bursitis, left hip: Secondary | ICD-10-CM | POA: Diagnosis not present

## 2024-01-16 DIAGNOSIS — R0609 Other forms of dyspnea: Secondary | ICD-10-CM | POA: Insufficient documentation

## 2024-01-16 DIAGNOSIS — M1811 Unilateral primary osteoarthritis of first carpometacarpal joint, right hand: Secondary | ICD-10-CM | POA: Diagnosis not present

## 2024-01-16 DIAGNOSIS — G8929 Other chronic pain: Secondary | ICD-10-CM | POA: Diagnosis not present

## 2024-01-16 NOTE — Assessment & Plan Note (Signed)
 Progressive exertional dyspnea Progressive exertional dyspnea primarily when walking uphill. Differential includes cardiac causes such as coronary artery disease and non-cardiac causes like deconditioning or pulmonary issues.  Echocardiogram pending at the time of our clinic visit, however the lack of any input from ordering physician was suggested it was relatively normal..  - Echocardiogram results were reviewed-noted above.  Essentially normal as expected.  Coronary CT angiography considered for accurate assessment of coronary stenoses - Order Coronary CT Angiogram. - Schedule follow-up to discuss results.

## 2024-01-16 NOTE — Assessment & Plan Note (Signed)
 Rare but occasionally notable sense of tightness with dyspnea while walking uphill and not stopping.  With normal echocardiogram and no other sequelae of symptoms to suggest acute pulmonary etiology, we discussed options for evaluation of CAD including Myoview, cardiac stress test versus coronary CTA. Given that we now have him results of an echocardiogram, and the importance of cardiac function from either PET for Myoview becomes less important and then more effective evaluation Coronary CTA will help exclude obstructive CAD.  If stress testing will be desired in the future, would probably consider CPX.

## 2024-01-20 ENCOUNTER — Encounter (HOSPITAL_COMMUNITY): Payer: Self-pay

## 2024-01-21 ENCOUNTER — Telehealth (HOSPITAL_COMMUNITY): Payer: Self-pay | Admitting: *Deleted

## 2024-01-21 NOTE — Telephone Encounter (Signed)

## 2024-01-22 ENCOUNTER — Ambulatory Visit (HOSPITAL_COMMUNITY)
Admission: RE | Admit: 2024-01-22 | Discharge: 2024-01-22 | Disposition: A | Discharge status: Home / Self Care | Source: Ambulatory Visit | Attending: Cardiology | Admitting: Cardiology

## 2024-01-22 DIAGNOSIS — R0609 Other forms of dyspnea: Secondary | ICD-10-CM | POA: Diagnosis not present

## 2024-01-22 DIAGNOSIS — R072 Precordial pain: Secondary | ICD-10-CM

## 2024-01-22 MED ORDER — DILTIAZEM HCL 25 MG/5ML IV SOLN: 10.0000 mg | INTRAVENOUS | Status: DC | PRN

## 2024-01-22 MED ORDER — IOHEXOL 350 MG/ML SOLN
100.0000 mL | Freq: Once | INTRAVENOUS | Status: AC | PRN
  Administered 2024-01-22: 100 mL via INTRAVENOUS

## 2024-01-22 MED ORDER — NITROGLYCERIN 0.4 MG SL SUBL
0.8000 mg | SUBLINGUAL_TABLET | Freq: Once | SUBLINGUAL | Status: AC
  Administered 2024-01-22: 0.8 mg via SUBLINGUAL

## 2024-01-22 MED ORDER — METOPROLOL TARTRATE 5 MG/5ML IV SOLN: 10.0000 mg | Freq: Once | INTRAVENOUS | Status: DC | PRN

## 2024-01-27 ENCOUNTER — Other Ambulatory Visit (HOSPITAL_BASED_OUTPATIENT_CLINIC_OR_DEPARTMENT_OTHER): Payer: Self-pay | Admitting: Obstetrics & Gynecology

## 2024-01-27 DIAGNOSIS — Z79890 Hormone replacement therapy (postmenopausal): Secondary | ICD-10-CM

## 2024-01-28 ENCOUNTER — Other Ambulatory Visit (HOSPITAL_BASED_OUTPATIENT_CLINIC_OR_DEPARTMENT_OTHER): Payer: Self-pay | Admitting: Obstetrics & Gynecology

## 2024-01-28 DIAGNOSIS — Z79890 Hormone replacement therapy (postmenopausal): Secondary | ICD-10-CM

## 2024-01-30 DIAGNOSIS — H6123 Impacted cerumen, bilateral: Secondary | ICD-10-CM | POA: Diagnosis not present

## 2024-01-31 ENCOUNTER — Ambulatory Visit: Payer: Self-pay | Admitting: Cardiology

## 2024-03-09 ENCOUNTER — Encounter: Payer: Self-pay | Admitting: Cardiology

## 2024-03-09 ENCOUNTER — Ambulatory Visit: Attending: Cardiology | Admitting: Cardiology

## 2024-03-09 VITALS — BP 120/68 | HR 76 | Ht 60.25 in | Wt 127.7 lb

## 2024-03-09 DIAGNOSIS — R072 Precordial pain: Secondary | ICD-10-CM | POA: Insufficient documentation

## 2024-03-09 DIAGNOSIS — I2510 Atherosclerotic heart disease of native coronary artery without angina pectoris: Secondary | ICD-10-CM | POA: Insufficient documentation

## 2024-03-09 DIAGNOSIS — R0609 Other forms of dyspnea: Secondary | ICD-10-CM

## 2024-03-09 DIAGNOSIS — R6889 Other general symptoms and signs: Secondary | ICD-10-CM | POA: Insufficient documentation

## 2024-03-09 DIAGNOSIS — H8190 Unspecified disorder of vestibular function, unspecified ear: Secondary | ICD-10-CM | POA: Diagnosis not present

## 2024-03-09 NOTE — Patient Instructions (Signed)
 Medication Instructions:  No  changes  *If you need a refill on your cardiac medications before your next appointment, please call your pharmacy*   Lab Work: Not needed    Testing/Procedures: Not needed   Follow-Up: At [facility], you and your health needs are our priority.  As part of our continuing mission to provide you with exceptional heart care, we have created designated Provider Care Teams.  These Care Teams include your primary Cardiologist (physician) and Advanced Practice Providers (APPs -  Physician Assistants and Nurse Practitioners) who all work together to provide you with the care you need, when you need it.     Your next appointment:   As needed ( depending on Echo results)    The format for your next appointment:   In Person  Provider:   Alm Clay, MD

## 2024-03-13 ENCOUNTER — Encounter: Payer: Self-pay | Admitting: Cardiology

## 2024-03-13 DIAGNOSIS — R6889 Other general symptoms and signs: Secondary | ICD-10-CM | POA: Insufficient documentation

## 2024-03-13 DIAGNOSIS — I2510 Atherosclerotic heart disease of native coronary artery without angina pectoris: Secondary | ICD-10-CM | POA: Insufficient documentation

## 2024-03-13 DIAGNOSIS — H8190 Unspecified disorder of vestibular function, unspecified ear: Secondary | ICD-10-CM | POA: Insufficient documentation

## 2024-03-13 NOTE — Assessment & Plan Note (Signed)
 Shortness of breath on exertion with normal echocardiogram , and mild coronary plaque not significant enough to cause symptoms. Pulmonology evaluation unremarkable. - Encourage continued exercise as tolerated.  If symptoms persist could consider CPX evaluation, and if cardiac etiology suspected then would consider microvascular disease.

## 2024-03-13 NOTE — Assessment & Plan Note (Signed)
 Precordial pain most likely musculoskeletal sternum discomfort.  Seems like it could be costochondritis related.  After reviewing her coronary CTA, there is no evidence of obstructive CAD.  Symptoms not consistent with angina so I do not expect to see evidence of macrovascular disease, and unlikely to have microvascular disease develops abruptly.

## 2024-03-13 NOTE — Assessment & Plan Note (Signed)
 Dizziness with positional changes suggests vestibular issue, possibly related to recent ear cleaning. Meclizine ineffective. No cardiac involvement. - Advise squatting instead of bending over. - Ensure adequate hydration.

## 2024-03-13 NOTE — Assessment & Plan Note (Signed)
 Fatigue and decreased energy without significant cardiac findings on coronary CT. Echocardiogram reported as normal as well..  - Encourage continued exercise as tolerated.

## 2024-03-13 NOTE — Progress Notes (Signed)
 Cardiology Office Note:  .   Date:  03/13/2024  ID:  Alexandra Cain, Alexandra Cain 1945-10-05, MRN [id number] PCP: Alexandra Millman, MD  [facility] Providers Cardiologist:  Alexandra Clay, MD     Chief Complaint  Patient presents with   Follow-up    To discuss test results.  Notes dizziness and shortness of breath on exertion as well as chest pain bending over    Patient Profile: .     Alexandra Cain is a essentially healthy newly minted  78 y.o. female  with a PMH notable for aortic atherosclerosis and hyperlipidemia as well as peripheral neuropathy who presents here for 56-month follow-up to discuss test results ordered to evaluate exertional dyspnea.  Pertinent PMH includes aortic atherosclerosis and hyperlipidemia -> She is also referred to Dr. Neda from Pulmonary Medicine for her dyspnea and was unimpressed.   PCP ordered an echocardiogram but the results were not available.      I initially saw Alexandra Cain on January 15, 2024 in consultation to evaluate dyspnea walking uphill as well as some positional dizziness.  No PND, orthopnea or edema. => Coronary CTA ordered to assess for ischemia.  Subjective  Discussed the use of AI scribe software for clinical note transcription with the patient, who gave verbal consent to proceed.  History of Present Illness History of Present Illness Alexandra Cain is a 78 year old female who presents with dizziness and fatigue. She was referred by Dr. Rolinda for evaluation.  She had an echocardiogram that we were not able to review.  Clinic note dated indicates that there were no abnormal findings.  She experiences dizziness primarily when bending over and then standing back up, which began after having her ears cleaned. No dizziness occurs when moving from sitting to standing or when turning her head side to side. The dizziness is not associated with palpitations or changes in heart rhythm. She was prescribed metolazone, which did not alleviate her  symptoms.  She reports significant fatigue and feeling winded when walking uphill. Despite regular exercise, including biking 10-20 miles a day on an indoor bike, she feels she has no energy. No chest pain occurs during exercise, but she notes discomfort in the sternum area, particularly in the morning upon waking.  A coronary CT scan revealed a coronary calcium score of 50, with mild plaque in the left anterior descending artery and right coronary artery. She is awaiting results from an echocardiogram.  Unfortunately, despite contacting the PCPs office at the time initial visit and then again after as well as on the day of this visit, we still do not have the results.  There is only a statement that the patient was notified about her echocardiogram being normal .  Normal left ventricular function.  No valvular abnormalities.  No structural abnormalities and no evidence of pulmonary hypertension.  She experiences discomfort in her sternum, described as not sharp or severe, occurring mostly at rest, particularly in the morning. She cannot sleep on her side due to hip bursitis, which causes her to sleep on her back.     Objective   Medications: Co-Q10 100 mg daily; rosuvastatin 5 mg daily; Estrace  0.5 mg daily; Flonase  50 mcg per nostril daily; gabapentin  100 mg 1-3 tab nightly for insomnia/hot flashes; Provera  2.5 mg daily; multivitamin daily.  Social History:Uses an aerodyne bike to exercise. Walks on a regular basis.   Studies Reviewed: Alexandra Cain   EKG Interpretation Date/Time:  Monday March 09 2024 12:11:38 EDT  Ventricular Rate:  62 PR Interval:  158 QRS Duration:  86 QT Interval:  386 QTC Calculation: 391 R Axis:   77  Text Interpretation: Normal sinus rhythm Normal ECG When compared with ECG of 15-Jan-2024 08:15, Vent. rate has decreased BY  33 BPM Nonspecific T wave abnormality no longer evident in Inferior leads QT has shortened Rate slower Confirmed by Alexandra Cain ([id number]) on 03/09/2024  12:54:09 PM    Results Results RADIOLOGY Coronary CT angiogram: Coronary calcium score of 57, mild atherosclerotic plaque with < 24% stenosis in the left anterior descending (LAD) artery and right coronary artery, no occlusions.  (01/23/2024) Echocardiogram: Full report not available.  Summary suggests echo was normal .  Heart pumping normally with no evidence of structural heart or valve disease.  No evidence of pulmonary hypertension.   Risk Assessment/Calculations:          Physical Exam:   VS:  BP 120/68   Pulse 76   Ht 5' 0.25 (1.53 m)   Wt 127 lb 11.2 oz (57.9 kg)   LMP 07/24/1991   SpO2 98%   BMI 24.73 kg/m    Wt Readings from Last 3 Encounters:  03/09/24 127 lb 11.2 oz (57.9 kg)  01/15/24 126 lb (57.2 kg)  01/02/24 125 lb 12.8 oz (57.1 kg)    GEN: Well nourished, well developed in no acute distress; healthy-appearing NECK: No JVD; No carotid bruits CARDIAC: Normal S1, S2; RRR, no murmurs, rubs, gallops; several areas of point tenderness along the left sternal border and manubrium. RESPIRATORY:  Clear to auscultation without rales, wheezing or rhonchi ; nonlabored, good air movement. ABDOMEN: Soft, non-tender, non-distended EXTREMITIES:  No edema; No deformity      ASSESSMENT AND PLAN: .    Problem List Items Addressed This Visit       Cardiology Problems   Non-occlusive coronary artery disease     Other   Dyspnea on exertion - Primary (Chronic)   Shortness of breath on exertion with normal echocardiogram , and mild coronary plaque not significant enough to cause symptoms. Pulmonology evaluation unremarkable. - Encourage continued exercise as tolerated.  If symptoms persist could consider CPX evaluation, and if cardiac etiology suspected then would consider microvascular disease.      Relevant Orders   EKG 12-Lead (Completed)   Exercise intolerance (Chronic)   Fatigue and decreased energy without significant cardiac findings on coronary CT.  Echocardiogram reported as normal as well..  - Encourage continued exercise as tolerated.      Precordial pain   Precordial pain most likely musculoskeletal sternum discomfort.  Seems like it could be costochondritis related.  After reviewing her coronary CTA, there is no evidence of obstructive CAD.  Symptoms not consistent with angina so I do not expect to see evidence of macrovascular disease, and unlikely to have microvascular disease develops abruptly.      Vestibular dizziness (Chronic)   Dizziness with positional changes suggests vestibular issue, possibly related to recent ear cleaning. Meclizine ineffective. No cardiac involvement. - Advise squatting instead of bending over. - Ensure adequate hydration.              Follow-Up: Return if symptoms worsen or fail to improve, for Followup when necessary.    Signed, Cain MICAEL Anner, MD, MS Cain Alexandra, M.D., M.S. Interventional Chartered certified accountant  Pager # [phone]

## 2024-03-16 ENCOUNTER — Ambulatory Visit: Admitting: Cardiology

## 2024-03-16 ENCOUNTER — Encounter (HOSPITAL_BASED_OUTPATIENT_CLINIC_OR_DEPARTMENT_OTHER): Payer: Self-pay | Admitting: Obstetrics & Gynecology

## 2024-03-16 ENCOUNTER — Ambulatory Visit (INDEPENDENT_AMBULATORY_CARE_PROVIDER_SITE_OTHER): Payer: Medicare Other | Admitting: Obstetrics & Gynecology

## 2024-03-16 VITALS — BP 118/61 | HR 89 | Ht 60.25 in | Wt 125.0 lb

## 2024-03-16 DIAGNOSIS — G8929 Other chronic pain: Secondary | ICD-10-CM

## 2024-03-16 DIAGNOSIS — Z1211 Encounter for screening for malignant neoplasm of colon: Secondary | ICD-10-CM | POA: Diagnosis not present

## 2024-03-16 DIAGNOSIS — R1901 Right upper quadrant abdominal swelling, mass and lump: Secondary | ICD-10-CM

## 2024-03-16 DIAGNOSIS — Z01411 Encounter for gynecological examination (general) (routine) with abnormal findings: Secondary | ICD-10-CM | POA: Diagnosis not present

## 2024-03-16 DIAGNOSIS — R1031 Right lower quadrant pain: Secondary | ICD-10-CM

## 2024-03-16 DIAGNOSIS — Z79890 Hormone replacement therapy (postmenopausal): Secondary | ICD-10-CM

## 2024-03-16 DIAGNOSIS — R232 Flushing: Secondary | ICD-10-CM

## 2024-03-16 MED ORDER — ESTRADIOL 0.5 MG PO TABS: 0.5000 mg | ORAL_TABLET | Freq: Every day | ORAL | 3 refills | Status: AC

## 2024-03-16 MED ORDER — MEDROXYPROGESTERONE ACETATE 2.5 MG PO TABS: 2.5000 mg | ORAL_TABLET | Freq: Every day | ORAL | 3 refills | Status: AC

## 2024-03-16 NOTE — Progress Notes (Unsigned)
 Breast and Pelvic Exam Patient name: Alexandra Cain MRN [id number]  Date of birth: 11-28-1945 Chief Complaint:   Breast and Pelvic Exam  History of Present Illness:   Alexandra Cain is a 78 y.o. G0P0 Caucasian female being seen today for breast and pelvic exam.  Denies vaginal bleeding.  H/o long term RLQ pain and she does have intermittent bloating/abdominal swelling.  Would like ca-125 done today.    On low dosed HRT.  We discussed lowering estrogen dosage.  She is willing to try.  Has tried to stop completely and has experienced intolerant hot flashes.  Desires to continue and aware of risks.  Patient's last menstrual period was 07/24/1991.   Last pap 01/11/2022. Results were: normal. H/O abnormal pap: no Last mammogram: 09/11/2023. Results were: normal. Family h/o breast cancer: yes maternal aunt Last colonoscopy: 08/26/2018. Results were: normal. Family h/o colorectal cancer: no DEXA:  03/23/2022.  T score -2.8, forearm.  Other sites with osteopenia.     03/16/2024    1:53 PM 04/01/2023    4:00 PM 02/14/2023    3:59 PM 01/11/2022   11:25 AM  Depression screen PHQ 2/9  Decreased Interest 1 0 2 0  Down, Depressed, Hopeless 1 0 2 0  PHQ - 2 Score 2 0 4 0  Altered sleeping 0 3 0   Tired, decreased energy  0 2   Change in appetite 0 0 0   Feeling bad or failure about yourself  1 0 3   Trouble concentrating 0 0 2   Moving slowly or fidgety/restless 0 0 0   Suicidal thoughts 0 0 0   PHQ-9 Score 3 3 11    Difficult doing work/chores  Not difficult at all       Review of Systems:   Pertinent items are noted in HPI Denies any urinary or bowel changes.  Continues to have bloating/RLQ pain. Pertinent History Reviewed:  Reviewed past medical,surgical, social and family history.  Reviewed problem list, medications and allergies. Physical Assessment:   Vitals:   03/16/24 1310  BP: 118/61  Pulse: 89  SpO2: 100%  Weight: 125 lb (56.7 kg)  Height: 5' 0.25 (1.53 m)  Body mass index is 24.21  kg/m.        Physical Examination:   General appearance - well appearing, and in no distress  Mental status - alert, oriented to person, place, and time  Psych:  She has a normal mood and affect  Skin - warm and dry, normal color, no suspicious lesions noted  Chest - effort normal, all lung fields clear to auscultation bilaterally  Heart - normal rate and regular rhythm  Neck:  midline trachea, no thyromegaly or nodules  Breasts - breasts appear normal, no suspicious masses, no skin or nipple changes or  axillary nodes  Abdomen - soft, nontender, nondistended, no masses or organomegaly  Pelvic - VULVA: normal appearing vulva with no masses, tenderness or lesions   VAGINA: atrophic vaginal tissue   CERVIX: small with some vaginal apex narrowing  Thin prep pap is not done   UTERUS: uterus is felt to be normal size, shape, consistency and nontender   ADNEXA: No adnexal masses or tenderness noted.  Rectal - normal rectal, good sphincter tone, no masses felt  Extremities:  No swelling or varicosities noted  Chaperone present for exam  No results found for this or any previous visit (from the past 24 hours).  Assessment & Plan:  1. Encntr for gyn exam (  general) (routine) w abnormal findings (Primary) - Pap smear 01/11/2022 - Mammogram 09/11/2023 - Colonoscopy 2020.  Follow up 5 years.   - Bone mineral density 2023 - lab work done with PCP - vaccines reviewed/updated  2. Colon cancer screening - Amb Referral to Colonoscopy  3. Hormone replacement therapy (HRT) - pt willing to decrease estradiol  and she will try to cut 0.5mg  tablet in 1/2 and see how she does.  Rx may need adjustment but will keep with current dosage in case she has unacceptable side effects and increases dosage - medroxyPROGESTERone  (PROVERA ) 2.5 MG tablet; Take 1 tablet (2.5 mg total) by mouth daily.  Dispense: 90 tablet; Refill: 3 - estradiol  (ESTRACE ) 0.5 MG tablet; Take 1 tablet (0.5 mg total) by mouth daily.   Dispense: 90 tablet; Refill: 3  4. Hot flashes  5. Chronic RLQ pain - CA 125  6. Right upper quadrant abdominal swelling, mass and lump - CA 125   Orders Placed This Encounter  Procedures   CA 125   Amb Referral to Colonoscopy    Meds:  Meds ordered this encounter  Medications   medroxyPROGESTERone  (PROVERA ) 2.5 MG tablet    Sig: Take 1 tablet (2.5 mg total) by mouth daily.    Dispense:  90 tablet    Refill:  3    Please send a replace/new response with 90-Day Supply if appropriate to maximize member benefit. Requesting 1 year supply.   estradiol  (ESTRACE ) 0.5 MG tablet    Sig: Take 1 tablet (0.5 mg total) by mouth daily.    Dispense:  90 tablet    Refill:  3    Please send a replace/new response with 90-Day Supply if appropriate to maximize member benefit. Requesting 1 year supply.    Follow-up: Return in about 1 year (around 03/16/2025).  Ronal GORMAN Pinal, MD 03/19/2024 10:03 PM

## 2024-03-17 ENCOUNTER — Ambulatory Visit (HOSPITAL_BASED_OUTPATIENT_CLINIC_OR_DEPARTMENT_OTHER): Payer: Self-pay | Admitting: Obstetrics & Gynecology

## 2024-03-17 LAB — CA 125: Cancer Antigen (CA) 125: 20.7 U/mL (ref 0.0–38.1)

## 2024-03-18 ENCOUNTER — Other Ambulatory Visit (INDEPENDENT_AMBULATORY_CARE_PROVIDER_SITE_OTHER): Payer: Self-pay

## 2024-03-18 ENCOUNTER — Encounter: Payer: Self-pay | Admitting: Orthopaedic Surgery

## 2024-03-18 ENCOUNTER — Ambulatory Visit: Admitting: Cardiology

## 2024-03-18 ENCOUNTER — Ambulatory Visit: Admitting: Orthopaedic Surgery

## 2024-03-18 DIAGNOSIS — M25512 Pain in left shoulder: Secondary | ICD-10-CM

## 2024-03-18 DIAGNOSIS — M25552 Pain in left hip: Secondary | ICD-10-CM

## 2024-03-18 DIAGNOSIS — G8929 Other chronic pain: Secondary | ICD-10-CM

## 2024-03-18 MED ORDER — LIDOCAINE HCL 1 % IJ SOLN
3.0000 mL | INTRAMUSCULAR | Status: AC | PRN
  Administered 2024-03-18: 3 mL

## 2024-03-18 MED ORDER — METHYLPREDNISOLONE ACETATE 40 MG/ML IJ SUSP
40.0000 mg | INTRAMUSCULAR | Status: AC | PRN
  Administered 2024-03-18: 40 mg via INTRA_ARTICULAR

## 2024-03-18 NOTE — Progress Notes (Signed)
 The patient is a 78 year old female that I am seeing for the first time.  She comes in with left hip pain for several months now and it hurts on the lateral aspect of her hip and some in the groin but mainly on the lateral side and she cannot lay on the left hip of the right hip without hurting.  She is also had a lot of shoulder pain and she points to the subdeltoid area as a source of her pain but no known injury.  She does state that her left shoulder pops quite a bit and is painful with different motions and activities.  She tries to stay very active.  She is younger appearing 55 with no other significant medical issues.  She is not a diabetic.  She is not on blood thinning medications either.  She does not walk with any assistive device.  On examination of her left shoulder her shoulder is well located.  There is a lot of clicking and popping mainly in the [facility] joint but the shoulder has no deficits in terms of rotator cuff weakness and the range of motion is appropriate.  X-rays of the left shoulder show some mild arthritic changes at the [facility] joint but otherwise a well-maintained glenohumeral joint.  There is slight decrease in the subacromial outlet.  Examination of the left hip shows it moves smoothly and fluidly with no blocks or rotation there is a little bit of pain in the groin but most of her pain is severe over the trochanteric area of the hip.  An AP pelvis and lateral left hip shows only minimal arthritic changes.  I did recommend a steroid injection in her left shoulder subacromial outlet and her left hip trochanteric area.  She agreed to these and tolerated them well.  Also showed her stretching exercises to try for the left hip.  We will then see her back in a month to see how she is doing overall.  She agrees with this treatment plan.    Procedure Note  Patient: Alexandra Cain             Date of Birth: [birth year]           MRN: [id number]             Visit Date:  03/18/2024  Procedures: Visit Diagnoses:  1. Pain of left hip   2. Chronic left shoulder pain     Large Joint Inj: L subacromial bursa on 03/18/2024 3:36 PM Indications: pain and diagnostic evaluation Details: 22 G 1.5 in needle  Arthrogram: No  Medications: 3 mL lidocaine  1 %; 40 mg methylPREDNISolone  acetate 40 MG/ML Outcome: tolerated well, no immediate complications Procedure, treatment alternatives, risks and benefits explained, specific risks discussed. Consent was given by the patient. Immediately prior to procedure a time out was called to verify the correct patient, procedure, equipment, support staff and site/side marked as required. Patient was prepped and draped in the usual sterile fashion.    Large Joint Inj: L greater trochanter on 03/18/2024 3:36 PM Indications: pain and diagnostic evaluation Details: 22 G 1.5 in needle, lateral approach  Arthrogram: No  Medications: 3 mL lidocaine  1 %; 40 mg methylPREDNISolone  acetate 40 MG/ML Outcome: tolerated well, no immediate complications Procedure, treatment alternatives, risks and benefits explained, specific risks discussed. Consent was given by the patient. Immediately prior to procedure a time out was called to verify the correct patient, procedure, equipment, support staff and site/side marked as required. Patient was  prepped and draped in the usual sterile fashion.

## 2024-03-30 DIAGNOSIS — L812 Freckles: Secondary | ICD-10-CM | POA: Diagnosis not present

## 2024-03-30 DIAGNOSIS — L821 Other seborrheic keratosis: Secondary | ICD-10-CM | POA: Diagnosis not present

## 2024-03-30 DIAGNOSIS — D1801 Hemangioma of skin and subcutaneous tissue: Secondary | ICD-10-CM | POA: Diagnosis not present

## 2024-03-30 DIAGNOSIS — D225 Melanocytic nevi of trunk: Secondary | ICD-10-CM | POA: Diagnosis not present

## 2024-04-06 ENCOUNTER — Ambulatory Visit: Admitting: Cardiology

## 2024-04-15 ENCOUNTER — Ambulatory Visit: Admitting: Orthopaedic Surgery

## 2024-04-17 DIAGNOSIS — H5213 Myopia, bilateral: Secondary | ICD-10-CM | POA: Diagnosis not present

## 2024-04-17 DIAGNOSIS — H353132 Nonexudative age-related macular degeneration, bilateral, intermediate dry stage: Secondary | ICD-10-CM | POA: Diagnosis not present

## 2024-04-17 DIAGNOSIS — Z961 Presence of intraocular lens: Secondary | ICD-10-CM | POA: Diagnosis not present

## 2024-04-22 ENCOUNTER — Ambulatory Visit: Admitting: Orthopaedic Surgery

## 2024-04-22 DIAGNOSIS — M25552 Pain in left hip: Secondary | ICD-10-CM

## 2024-04-22 DIAGNOSIS — G8929 Other chronic pain: Secondary | ICD-10-CM | POA: Diagnosis not present

## 2024-04-22 DIAGNOSIS — M25512 Pain in left shoulder: Secondary | ICD-10-CM | POA: Diagnosis not present

## 2024-04-22 NOTE — Progress Notes (Signed)
 The patient is an active and Jha appearing 78 year old female who comes in today for follow-up after we placed a steroid injection in her left shoulder subacromial outlet for impingement and an injection over her left hip trochanteric area.  She said the shoulder injection has worked great but the hip injection has not lasted much at all.  She cannot lay on that left side.  She does have a little bit of groin pain.  Her previous x-rays showed that the joint space on her hips is still well-maintained.  On exam today her left shoulder is moving smoothly and fluidly and the popping is less and overall she looks great and the shoulder feels better overall.  Her left hip does have significant pain still to palpation over the left hip trochanteric area and some pain on the extremes or rotation as well with little in the groin and some on the lateral aspect of the left hip.  I would like her to alternate Voltaren gel and Salonpas patches over the left hip trochanteric area and we will send her to [facility] physical therapy where she has been before.  We want them to try the modalities over her left hip including dry needling to see if we can get this to calm down.  Will then see her back in 4 weeks and not opposed to 1 more steroid injection at that visit since she is not a diabetic and is not in the joint.  She agrees to this treatment plan.

## 2024-04-27 ENCOUNTER — Ambulatory Visit: Admitting: Orthopaedic Surgery

## 2024-04-29 DIAGNOSIS — M5416 Radiculopathy, lumbar region: Secondary | ICD-10-CM | POA: Diagnosis not present

## 2024-04-29 DIAGNOSIS — M25552 Pain in left hip: Secondary | ICD-10-CM | POA: Diagnosis not present

## 2024-05-04 ENCOUNTER — Telehealth: Payer: Self-pay | Admitting: Orthopaedic Surgery

## 2024-05-04 NOTE — Telephone Encounter (Signed)
 Patient called. She would like a MRI ordered for her L hip. Her cb# [phone]. Nothing has helped.

## 2024-05-05 ENCOUNTER — Other Ambulatory Visit: Payer: Self-pay | Admitting: Radiology

## 2024-05-05 DIAGNOSIS — M25552 Pain in left hip: Secondary | ICD-10-CM

## 2024-05-05 NOTE — Telephone Encounter (Signed)
 Called patient

## 2024-05-15 ENCOUNTER — Ambulatory Visit
Admission: RE | Admit: 2024-05-15 | Discharge: 2024-05-15 | Disposition: A | Discharge status: Home / Self Care | Source: Ambulatory Visit | Attending: Physician Assistant | Admitting: Physician Assistant

## 2024-05-15 DIAGNOSIS — M25552 Pain in left hip: Secondary | ICD-10-CM

## 2024-05-15 DIAGNOSIS — M1612 Unilateral primary osteoarthritis, left hip: Secondary | ICD-10-CM | POA: Diagnosis not present

## 2024-05-16 DIAGNOSIS — Z23 Encounter for immunization: Secondary | ICD-10-CM | POA: Diagnosis not present

## 2024-05-19 ENCOUNTER — Other Ambulatory Visit

## 2024-05-19 DIAGNOSIS — Z23 Encounter for immunization: Secondary | ICD-10-CM | POA: Diagnosis not present

## 2024-05-25 ENCOUNTER — Other Ambulatory Visit: Payer: Self-pay

## 2024-05-25 ENCOUNTER — Ambulatory Visit (INDEPENDENT_AMBULATORY_CARE_PROVIDER_SITE_OTHER): Admitting: Orthopaedic Surgery

## 2024-05-25 ENCOUNTER — Encounter: Payer: Self-pay | Admitting: Sports Medicine

## 2024-05-25 ENCOUNTER — Ambulatory Visit: Admitting: Sports Medicine

## 2024-05-25 ENCOUNTER — Encounter: Payer: Self-pay | Admitting: Orthopaedic Surgery

## 2024-05-25 ENCOUNTER — Encounter: Payer: Self-pay | Admitting: Radiology

## 2024-05-25 DIAGNOSIS — M7062 Trochanteric bursitis, left hip: Secondary | ICD-10-CM

## 2024-05-25 DIAGNOSIS — M25552 Pain in left hip: Secondary | ICD-10-CM | POA: Diagnosis not present

## 2024-05-25 DIAGNOSIS — M1612 Unilateral primary osteoarthritis, left hip: Secondary | ICD-10-CM | POA: Insufficient documentation

## 2024-05-25 MED ORDER — METHYLPREDNISOLONE ACETATE 40 MG/ML IJ SUSP
60.0000 mg | INTRAMUSCULAR | Status: AC | PRN
  Administered 2024-05-25: 60 mg via INTRA_ARTICULAR

## 2024-05-25 MED ORDER — LIDOCAINE HCL 1 % IJ SOLN
4.0000 mL | INTRAMUSCULAR | Status: AC | PRN
  Administered 2024-05-25: 4 mL

## 2024-05-25 NOTE — Progress Notes (Signed)
 The patient is a 78 year old female who comes in today to go over MRI of her left hip.  Her plain films show well-maintained hip joint space and she is having a lot of lateral pain laying on that hip at night and even rolling over to her right hip causing some pain in her left hip on the side of the hip.  She has had some groin pain as well.  She went to [facility] physical therapy and they did try dry needling.  I have even placed a steroid injection over the left hip trochanteric area.  She walks without any assistive device.  She does report some groin pain.  She stays very active and does not walk with a significant limp.  On my exam today again she has full internal/external rotation of the left hip with no blocks rotation and there is some pain in the groin but also pain on the lateral aspect of her left hip.  This has been going on for about 8 months now.  The MRI of her left hip does show moderate arthritis in the hip with thinning of the articular cartilage as well as degenerative labral tearing.  I did show her a hip model and explained what the MRI shows.  I do feel as though she that is worth sending her to Dr. Burnetta for an intra-articular steroid injection under ultrasound of the left hip joint.  We can then see her back in follow-up 3 to 4 weeks later to see how that is done for her.  Obviously we can discuss hip replacement surgery if he is to the point where her pain is significant enough and detrimentally affecting her mobility and her quality of life enough to consider hip replacement surgery.

## 2024-05-25 NOTE — Progress Notes (Signed)
   Procedure Note  Patient: Alexandra Cain             Date of Birth: 02-09-1946           MRN: [id number]             Visit Date: 05/25/2024  Procedures: Visit Diagnoses:  1. Unilateral primary osteoarthritis, left hip   2. Pain of left hip    Large Joint Inj: L hip joint on 05/25/2024 2:27 PM Indications: pain Details: 22 G 3.5 in needle, ultrasound-guided anterior approach Medications: 4 mL lidocaine  1 %; 60 mg methylPREDNISolone  acetate 40 MG/ML Outcome: tolerated well, no immediate complications  Procedure: US -guided intra-articular hip injection, Left After discussion on risks/benefits/indications and informed verbal consent was obtained, a timeout was performed. Patient was lying supine on exam table. The hip was cleaned with betadine and alcohol swabs. Then utilizing ultrasound guidance, the patient's femoral head and neck junction was identified and subsequently injected with 4:1.5 lidocaine :depomedrol via an in-plane approach with ultrasound visualization of the injectate administered into the hip joint. Patient tolerated procedure well without immediate complications.  Procedure, treatment alternatives, risks and benefits explained, specific risks discussed. Consent was given by the patient. Immediately prior to procedure a time out was called to verify the correct patient, procedure, equipment, support staff and site/side marked as required. Patient was prepped and draped in the usual sterile fashion.     - patient tolerated procedure well, discussed post-injection protocol - follow-up with Dr. Lorane Gaskins, PAC as indicated in 3-4 weeks; I am happy to see them as needed  Lonell Sprang, DO Primary Care Sports Medicine Physician  [facility] - Orthopedics  This note was dictated using Dragon naturally speaking software and may contain errors in syntax, spelling, or content which have not been identified prior to signing this note.

## 2024-06-17 DIAGNOSIS — M810 Age-related osteoporosis without current pathological fracture: Secondary | ICD-10-CM | POA: Diagnosis not present

## 2024-06-17 DIAGNOSIS — E785 Hyperlipidemia, unspecified: Secondary | ICD-10-CM | POA: Diagnosis not present

## 2024-06-17 DIAGNOSIS — R8290 Unspecified abnormal findings in urine: Secondary | ICD-10-CM | POA: Diagnosis not present

## 2024-06-17 DIAGNOSIS — Z1331 Encounter for screening for depression: Secondary | ICD-10-CM | POA: Diagnosis not present

## 2024-06-17 DIAGNOSIS — Z23 Encounter for immunization: Secondary | ICD-10-CM | POA: Diagnosis not present

## 2024-06-17 DIAGNOSIS — Z79899 Other long term (current) drug therapy: Secondary | ICD-10-CM | POA: Diagnosis not present

## 2024-06-17 DIAGNOSIS — Z0000 Encounter for general adult medical examination without abnormal findings: Secondary | ICD-10-CM | POA: Diagnosis not present

## 2024-06-17 DIAGNOSIS — J309 Allergic rhinitis, unspecified: Secondary | ICD-10-CM | POA: Diagnosis not present

## 2024-06-17 DIAGNOSIS — E559 Vitamin D deficiency, unspecified: Secondary | ICD-10-CM | POA: Diagnosis not present

## 2024-06-17 DIAGNOSIS — R5383 Other fatigue: Secondary | ICD-10-CM | POA: Diagnosis not present

## 2024-06-17 DIAGNOSIS — R7303 Prediabetes: Secondary | ICD-10-CM | POA: Diagnosis not present

## 2024-06-17 DIAGNOSIS — E782 Mixed hyperlipidemia: Secondary | ICD-10-CM | POA: Diagnosis not present

## 2024-06-24 ENCOUNTER — Ambulatory Visit: Admitting: Orthopaedic Surgery

## 2024-06-24 ENCOUNTER — Other Ambulatory Visit (HOSPITAL_BASED_OUTPATIENT_CLINIC_OR_DEPARTMENT_OTHER): Payer: Self-pay | Admitting: Family Medicine

## 2024-06-24 DIAGNOSIS — M810 Age-related osteoporosis without current pathological fracture: Secondary | ICD-10-CM

## 2024-06-29 DIAGNOSIS — L820 Inflamed seborrheic keratosis: Secondary | ICD-10-CM | POA: Diagnosis not present

## 2024-06-29 DIAGNOSIS — Z79899 Other long term (current) drug therapy: Secondary | ICD-10-CM | POA: Diagnosis not present

## 2024-06-29 DIAGNOSIS — L650 Telogen effluvium: Secondary | ICD-10-CM | POA: Diagnosis not present

## 2024-07-24 ENCOUNTER — Ambulatory Visit: Admitting: Sports Medicine

## 2024-08-04 ENCOUNTER — Other Ambulatory Visit: Payer: Self-pay | Admitting: Family Medicine

## 2024-08-04 DIAGNOSIS — Z1231 Encounter for screening mammogram for malignant neoplasm of breast: Secondary | ICD-10-CM

## 2024-08-11 ENCOUNTER — Ambulatory Visit: Admitting: Sports Medicine

## 2024-08-11 ENCOUNTER — Encounter: Payer: Self-pay | Admitting: Sports Medicine

## 2024-08-11 DIAGNOSIS — M1612 Unilateral primary osteoarthritis, left hip: Secondary | ICD-10-CM

## 2024-08-11 NOTE — Progress Notes (Signed)
 This was a no-chart visit. Patient came today desiring injection, but it is too early for repeat injection. MA did recommend f/u with Dr. Vernetta per his original note after injection. She may mychart.  She may schedule f/u and/or US -injection for the hip anytime > 3 months from 05/25/24 (last injection).   No charge visit.  Lonell Sprang, DO Primary Care Sports Medicine Physician  [facility] - Orthopedics  This note was dictated using Dragon naturally speaking software and may contain errors in syntax, spelling, or content which have not been identified prior to signing this note.

## 2024-08-28 ENCOUNTER — Ambulatory Visit: Admitting: Sports Medicine

## 2024-08-28 ENCOUNTER — Other Ambulatory Visit: Payer: Self-pay

## 2024-08-28 ENCOUNTER — Encounter: Payer: Self-pay | Admitting: Sports Medicine

## 2024-08-28 DIAGNOSIS — G8929 Other chronic pain: Secondary | ICD-10-CM

## 2024-08-28 DIAGNOSIS — M1612 Unilateral primary osteoarthritis, left hip: Secondary | ICD-10-CM

## 2024-08-28 DIAGNOSIS — M7062 Trochanteric bursitis, left hip: Secondary | ICD-10-CM

## 2024-08-28 MED ORDER — LIDOCAINE HCL 1 % IJ SOLN
4.0000 mL | INTRAMUSCULAR | Status: AC | PRN
  Administered 2024-08-28: 4 mL

## 2024-08-28 MED ORDER — METHYLPREDNISOLONE ACETATE 40 MG/ML IJ SUSP
60.0000 mg | INTRAMUSCULAR | Status: AC | PRN
  Administered 2024-08-28: 60 mg via INTRA_ARTICULAR

## 2024-08-28 NOTE — Progress Notes (Signed)
 "  Alexandra Cain - 79 y.o. female MRN [id number]  Date of birth: 06-Nov-1945  Office Visit Note: Visit Date: 08/28/2024 PCP: Rolinda Millman, MD Referred by: Rolinda Millman, MD  Subjective: Chief Complaint  Patient presents with   Left Hip - Pain   HPI: Alexandra Cain is a pleasant 79 y.o. female who presents today for acute on chronic left hip pain.  She has had pain for years which she usually localizes over the lateral aspect of the hip but at times does go into the groin.  She did have a greater trochanteric injection, palpation guided on 03/18/2024 which did not help her really at all.  On 05/25/2024 we did see her for an intra-articular hip injection only under ultrasound which gave her very good relief.  It did take days to 1 week to fully kick in.  Recently her pain has returned.  She does not have much pain during the day but it does bother her at nighttime.  She has more pain when she lays on the contralateral right hip she does use gabapentin  300 mg nightly.  Pertinent ROS were reviewed with the patient and found to be negative unless otherwise specified above in HPI.   Assessment & Plan: Visit Diagnoses:  1. Unilateral primary osteoarthritis, left hip   2. Trochanteric bursitis, left hip   3. Chronic left hip pain    Plan: Impression is acute exacerbation of chronic left hip pain.  Patient underwent ultrasound-guided intra-articular injection on 05/25/2024 which took about a week to kick in but then had very good relief of symptoms until here recently returned.  Her pain has always been over the lateral aspect of the hip but at times in the groin, she did have a landmark guided greater trochanteric injection on 03/18/2024 without benefit.  Possible she is having more referred pain from her moderate hip arthritis that radiates laterally.  Through shared decision making, did proceed with ultrasound-guided intra-articular hip injection, patient tolerated well.  Advised on postinjection protocol.   May use ice/heat and/or Tylenol  as needed.  She will continue her gabapentin  300 mg nightly.  Myles Dene to assess how the hip does over the next 1-2 weeks, if for some reason she does not receive significant relief, we could consider a diagnostic and therapeutic greater trochanteric injection under ultrasound guidance specifically.  Otherwise she will follow-up.  She may call or return sooner if any issues arise.  Follow-up: Return in about 3 months (around 11/25/2024) for Left hip   Meds & Orders: No orders of the defined types were placed in this encounter.   Orders Placed This Encounter  Procedures   Large Joint Inj   US  Guided Needle Placement - No Linked Charges     Procedures: Large Joint Inj: L hip joint on 08/28/2024 1:23 PM Indications: pain Details: 22 G 3.5 in needle, ultrasound-guided anterior approach Medications: 4 mL lidocaine  1 %; 60 mg methylPREDNISolone  acetate 40 MG/ML Outcome: tolerated well, no immediate complications  Procedure: US -guided intra-articular hip injection, Left After discussion on risks/benefits/indications and informed verbal consent was obtained, a timeout was performed. Patient was lying supine on exam table. The hip was cleaned with betadine and alcohol swabs. Then utilizing ultrasound guidance, the patient's femoral head and neck junction was identified and subsequently injected with 4:1.5 lidocaine :depomedrol via an in-plane approach with ultrasound visualization of the injectate administered into the hip joint. Patient tolerated procedure well without immediate complications.  Procedure, treatment alternatives, risks and benefits explained, specific  risks discussed. Consent was given by the patient. Immediately prior to procedure a time out was called to verify the correct patient, procedure, equipment, support staff and site/side marked as required. Patient was prepped and draped in the usual sterile fashion.          Clinical History: No  specialty comments available.  She reports that she has never smoked. She has never used smokeless tobacco. No results for input(s): HGBA1C, LABURIC in the last 8760 hours.  Objective:   Vital Signs: LMP 07/24/1991   Physical Exam  Gen: Well-appearing, in no acute distress; non-toxic CV: Well-perfused. Warm.  Resp: Breathing unlabored on room air; no wheezing. Psych: Fluid speech in conversation; appropriate affect; normal thought process  Ortho Exam  - Left hip: + TTP over the greater trochanteric region without significant soft tissue swelling.  She does have some pain with end range internal logroll.  Positive FADIR test, equivocal FABER testing.  Imaging:  *Independent review of 2 view left hip x-ray from 03/18/2024 as well as left hip MRI from 05/15/2024 was independently reviewed and interpreted by myself today.  X-rays demonstrate only mild osteoarthritic change with a well-seated femoral head.  MRI shows areas of higher grade cartilage loss with at least moderate osteoarthritic change and early subchondral cystic change within the medial femoral head.  There is a degree of trochanteric bursitis and insertional gluteal tendinopathy noted.  Narrative & Impression  CLINICAL DATA:  Left hip and groin pain for approximately 8 months. No known injury.   EXAM: MR OF THE LEFT HIP WITHOUT CONTRAST   TECHNIQUE: Multiplanar, multisequence MR imaging was performed. No intravenous contrast was administered.   COMPARISON:  Plain films left hip 03/18/2024.   FINDINGS: Bones: No fracture, stress change or worrisome lesion is identified. Small subchondral cysts are seen in the medial aspect of the left femoral head and acetabulum. No avascular necrosis of the femoral heads. Mild bilateral SI joint degenerative disease is more notable on the left.   Articular cartilage and labrum   Articular cartilage: Moderately thinned and frayed on the left throughout.   Labrum: The left  anterior, superior labrum is degenerated and frayed.   Joint or bursal effusion   Joint effusion:  None.   Bursae: There is a small volume of fluid in the trochanteric bursa bilaterally.   Muscles and tendons   Muscles and tendons: Intact. Very mild edema is seen about the proximal right hamstrings compatible with tendinosis.   Other findings   Miscellaneous:   None.   IMPRESSION: 1. Moderate left hip osteoarthritis with associated degeneration and fraying of the left anterior, superior labrum. 2. Mild bilateral trochanteric bursitis. 3. Mild bilateral SI joint osteoarthritis is more notable on the left. 4. Mild right hamstring tendinosis without tear.     Electronically Signed   By: Debby Prader M.D.   On: 05/19/2024 10:36   Past Medical/Family/Surgical/Social History: Medications & Allergies reviewed per EMR, new medications updated. Patient Active Problem List   Diagnosis Date Noted   Unilateral primary osteoarthritis, left hip 05/25/2024   Non-occlusive coronary artery disease 03/13/2024   Exercise intolerance 03/13/2024   Vestibular dizziness 03/13/2024   Dyspnea on exertion 01/16/2024   Precordial pain 01/16/2024   Hormone replacement therapy (HRT) 10/27/2020   Right lower quadrant abdominal swelling, mass and lump  10/27/2020   Primary insomnia 10/27/2020   Impingement syndrome of right shoulder 02/03/2019   Chronic RLQ pain 06/21/2017   Postmenopausal atrophic vaginitis 11/13/2012   Past  Medical History:  Diagnosis Date   Atherosclerosis of aorta    Chronic abdominal pain    Chronic nausea    COVID    Endometriosis 09/21/1994   bx neg, suspicious for endo   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 Tawni Mae   CYST EXCISION Left 04/2015   ear    CYST EXCISION Left 09/2015   Shoulder   DILATION AND CURETTAGE OF UTERUS  1993   EYE SURGERY  11/02/14   FOOT SURGERY     OTHER SURGICAL HISTORY  3/07   excision fo cx granuloma tissue under anesthesia   PELVIC LAPAROSCOPY  3/96   due to pelvic pain   TONSILLECTOMY AND ADENOIDECTOMY     Social History   Occupational History   Not on file  Tobacco Use   Smoking status: Never   Smokeless tobacco: Never  Vaping Use   Vaping status: Never Used  Substance and Sexual Activity   Alcohol use: No   Drug use: No   Sexual activity: Yes    Partners: Male    Birth control/protection: Post-menopausal, None   "

## 2024-08-28 NOTE — Progress Notes (Signed)
 Patient is here for a hip injection today. She says that her pain is over the side of the hip and down the side of the leg. It only bothers her when she is sleeping, and she says that it does not make a difference whether she is laying on her right or left side.

## 2024-09-16 ENCOUNTER — Ambulatory Visit

## 2024-11-26 ENCOUNTER — Ambulatory Visit: Admitting: Sports Medicine

## 2025-03-22 ENCOUNTER — Ambulatory Visit (HOSPITAL_BASED_OUTPATIENT_CLINIC_OR_DEPARTMENT_OTHER): Admitting: Obstetrics & Gynecology
